# Patient Record
Sex: Female | Born: 2000
Health system: Southern US, Community
[De-identification: ages and names within clinical notes are randomized; demographics above are authoritative.]

## PROBLEM LIST (undated history)

## (undated) DIAGNOSIS — J45909 Unspecified asthma, uncomplicated: Secondary | ICD-10-CM

## (undated) DIAGNOSIS — F329 Major depressive disorder, single episode, unspecified: Secondary | ICD-10-CM

## (undated) DIAGNOSIS — L709 Acne, unspecified: Secondary | ICD-10-CM

## (undated) DIAGNOSIS — F32A Depression, unspecified: Secondary | ICD-10-CM

## (undated) DIAGNOSIS — F419 Anxiety disorder, unspecified: Secondary | ICD-10-CM

## (undated) HISTORY — DX: Acne, unspecified: L70.9

## (undated) HISTORY — DX: Depression, unspecified: F32.A

## (undated) HISTORY — DX: Anxiety disorder, unspecified: F41.9

## (undated) HISTORY — DX: Major depressive disorder, single episode, unspecified: F32.9

## (undated) HISTORY — DX: Unspecified asthma, uncomplicated: J45.909

---
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
## 2019-03-08 DIAGNOSIS — Z79899 Other long term (current) drug therapy: Secondary | ICD-10-CM | POA: Diagnosis not present

## 2019-03-08 DIAGNOSIS — L700 Acne vulgaris: Secondary | ICD-10-CM | POA: Diagnosis not present

## 2019-03-23 ENCOUNTER — Telehealth: Payer: Self-pay | Admitting: Psychiatry

## 2019-03-23 NOTE — Telephone Encounter (Signed)
Received Fax message - requesting clearance for Isotretinoin initiation from [facility]. Patient needs appointment since she has been noncompliant with treatment recommendations.

## 2019-04-16 ENCOUNTER — Telehealth: Payer: Self-pay | Admitting: Psychiatry

## 2019-04-30 ENCOUNTER — Ambulatory Visit (INDEPENDENT_AMBULATORY_CARE_PROVIDER_SITE_OTHER): Payer: No Typology Code available for payment source | Admitting: Psychiatry

## 2019-04-30 ENCOUNTER — Encounter: Payer: Self-pay | Admitting: Psychiatry

## 2019-04-30 ENCOUNTER — Other Ambulatory Visit: Payer: Self-pay

## 2019-04-30 DIAGNOSIS — F411 Generalized anxiety disorder: Secondary | ICD-10-CM | POA: Diagnosis not present

## 2019-04-30 DIAGNOSIS — Z9114 Patient's other noncompliance with medication regimen: Secondary | ICD-10-CM

## 2019-04-30 DIAGNOSIS — F321 Major depressive disorder, single episode, moderate: Secondary | ICD-10-CM

## 2019-04-30 DIAGNOSIS — Z91148 Patient's other noncompliance with medication regimen for other reason: Secondary | ICD-10-CM

## 2019-04-30 DIAGNOSIS — F5105 Insomnia due to other mental disorder: Secondary | ICD-10-CM | POA: Insufficient documentation

## 2019-04-30 MED ORDER — HYDROXYZINE HCL 25 MG PO TABS: 12.5000 mg | ORAL_TABLET | Freq: Three times a day (TID) | ORAL | 1 refills | Status: DC | PRN

## 2019-04-30 MED ORDER — SERTRALINE HCL 50 MG PO TABS: 50.0000 mg | ORAL_TABLET | Freq: Every day | ORAL | 0 refills | Status: DC

## 2019-04-30 NOTE — Progress Notes (Signed)
Virtual Visit via Telephone Note  I connected with Shelly Knight on 04/30/19 at 10:45 AM EDT by telephone and verified that I am speaking with the correct person using two identifiers.   I discussed the limitations, risks, security and privacy concerns of performing an evaluation and management service by telephone and the availability of in person appointments. I also discussed with the patient that there may be a patient responsible charge related to this service. The patient expressed understanding and agreed to proceed.    I discussed the assessment and treatment plan with the patient. The patient was provided an opportunity to ask questions and all were answered. The patient agreed with the plan and demonstrated an understanding of the instructions.   The patient was advised to call back or seek an in-person evaluation if the symptoms worsen or if the condition fails to improve as anticipated.   BH MD OP Progress Note  04/30/2019 5:07 PM Shelly Knight  MRN:  [id number]  Chief Complaint:  Chief Complaint    Follow-up     HPI: Shelly Knight is a 18 year old female, [place], has a history of depression, anxiety, was evaluated by telemedicine today.  Patient preferred to do a phone call.  Patient has been noncompliant with her follow-up visits.  Last appointment was 11/20/2018.  Patient at that visit was advised to return in 10 days however did not follow-up with recommendations.  Patient today reports she has been noncompliant with her medications.  She reports some anxiety symptoms on and off.  She reports she would like to get back on her Zoloft at this time.  She however denies any sleep problems or significant depression at this time.  She denies any suicidality.  She denies any homicidality.  Patient reports she is here today to reestablish care and agrees to keep up with follow-up appointments.  Patient denies any other concerns today.   Visit  Diagnosis:    ICD-10-CM   1. Generalized anxiety disorder  F41.1 hydrOXYzine (ATARAX/VISTARIL) 25 MG tablet    sertraline (ZOLOFT) 50 MG tablet  2. Major depressive disorder, single episode, moderate (HCC)  F32.1 hydrOXYzine (ATARAX/VISTARIL) 25 MG tablet    sertraline (ZOLOFT) 50 MG tablet  3. Noncompliance with medication regimen  Z91.14     Past Psychiatric History: I have reviewed past psychiatric history from my progress note from 03/01/2018.  Past trials of Zoloft.  Past Medical History:  Past Medical History:  Diagnosis Date  . Acne   . Anxiety   . Asthma   . Depression    History reviewed. No pertinent surgical history.  Family Psychiatric History: I have reviewed family psychiatric history from my progress note on 03/01/2018.  Family History:  Family History  Problem Relation Age of Onset  . Kidney cancer Maternal Uncle   . Bipolar disorder Maternal Uncle   . Alcohol abuse Maternal Uncle   . Breast cancer Other   . Bipolar disorder Mother   . Anxiety disorder Father   . Bipolar disorder Sister   . Bipolar disorder Maternal Grandfather     Social History: I have reviewed social history from my progress note on 03/01/2018. Social History   Socioeconomic History  . Marital status: Single    Spouse name: Not on file  . Number of children: Not on file  . Years of education: Not on file  . Highest education level: High school graduate  Occupational History  . Not on file  Social Needs  .  Financial resource strain: Not hard at all  . Food insecurity    Worry: Never true    Inability: Never true  . Transportation needs    Medical: No    Non-medical: No  Tobacco Use  . Smoking status: Never Smoker  . Smokeless tobacco: Never Used  Substance and Sexual Activity  . Alcohol use: No    Frequency: Never  . Drug use: No  . Sexual activity: Yes    Birth control/protection: Pill  Lifestyle  . Physical activity    Days per week: 1 day    Minutes per session: 10  min  . Stress: Only a little  Relationships  . Social Herbalist on phone: Once a week    Gets together: Once a week    Attends religious service: Never    Active member of club or organization: No    Attends meetings of clubs or organizations: Never    Relationship status: Never married  Other Topics Concern  . Not on file  Social History Narrative  . Not on file    Allergies:  Allergies  Allergen Reactions  . Omnicef [Cefdinir] Rash    Metabolic Disorder Labs: No results found for: HGBA1C, MPG No results found for: PROLACTIN No results found for: CHOL, TRIG, HDL, CHOLHDL, VLDL, LDLCALC Lab Results  Component Value Date   TSH 4.125 11/07/2018    Therapeutic Level Labs: No results found for: LITHIUM No results found for: VALPROATE No components found for:  CBMZ  Current Medications: Current Outpatient Medications  Medication Sig Dispense Refill  . hydrOXYzine (ATARAX/VISTARIL) 25 MG tablet Take 0.5-1 tablets (12.5-25 mg total) by mouth 3 (three) times daily as needed. For severe anxiety attacks 90 tablet 1  . ISOtretinoin (ACCUTANE) 30 MG capsule Take 30 mg by mouth daily.    Marland Kitchen LORazepam (ATIVAN) 0.5 MG tablet Take 1 tablet (0.5 mg total) by mouth every 8 (eight) hours as needed for anxiety. 20 tablet 0  . naproxen (NAPROSYN) 500 MG tablet Take 1 tablet (500 mg total) by mouth 2 (two) times daily. 10 tablet 0  . norethindrone-ethinyl estradiol (ORTHO-NOVUM 7/7/7, 28,) 0.5/0.75/1-35 MG-MCG tablet Take 1 tablet by mouth daily. 3 Package 3  . omeprazole (PRILOSEC) 20 MG capsule Take by mouth.    . promethazine (PHENERGAN) 25 MG tablet Take 1 tablet by mouth up to every 6 hours as needed for nausea/mild or moderate discomfort.  Do not combine with anxiety med.    . sertraline (ZOLOFT) 50 MG tablet Take 1 tablet (50 mg total) by mouth daily with breakfast. 30 tablet 0   No current facility-administered medications for this visit.       Musculoskeletal: Strength & Muscle Tone: UTA Gait & Station: UTA Patient leans: N/A  Psychiatric Specialty Exam: Review of Systems  Psychiatric/Behavioral: The patient is nervous/anxious.   All other systems reviewed and are negative.   There were no vitals taken for this visit.There is no height or weight on file to calculate BMI.  General Appearance: UTA  Eye Contact:  UTA  Speech:  Normal Rate  Volume:  Normal  Mood:  Anxious on and off   Affect:  UTA  Thought Process:  Goal Directed and Descriptions of Associations: Intact  Orientation:  Full (Time, Place, and Person)  Thought Content: Logical   Suicidal Thoughts:  No  Homicidal Thoughts:  No  Memory:  Immediate;   Fair Recent;   Fair Remote;   Fair  Judgement:  Fair  Insight:  Fair  Psychomotor Activity:  UTA  Concentration:  Concentration: Fair and Attention Span: Fair  Recall:  FiservFair  Fund of Knowledge: Fair  Language: Fair  Akathisia:  No  Handed:  Right  AIMS (if indicated): denies tremors, rigidity  Assets:  Communication Skills Desire for Improvement Housing Social Support  ADL's:  Intact  Cognition: WNL  Sleep:  Fair   Screenings:   Assessment and Plan: Shelly Knight is a 18 year old Caucasian female, single, lives in [place], was evaluated by telemedicine today.  Patient today reports she is noncompliant with medications however is interested in restarting medication regimen.  Plan as noted below.  Plan For GAD-unstable Restart Zoloft 50 mg p.o. daily Hydroxyzine as needed.   For MDD- stable We will monitor closely.  For noncompliance with medications We will continue to educate.  Patient was last seen on 11/20/2018 and was restarted on medications as well as was referred for psychotherapy sessions however was noncompliant.  Discussed with patient that she may be dismissed from clinic if she is not following recommendations.  Follow-up in clinic in 4 weeks or sooner if needed.  I  have spent atleast 15 minutes non  face to face with patient today. More than 50 % of the time was spent for psychoeducation and supportive psychotherapy and care coordination.  This note was generated in part or whole with voice recognition software. Voice recognition is usually quite accurate but there are transcription errors that can and very often do occur. I apologize for any typographical errors that were not detected and corrected.        Jomarie LongsSaramma Trent Gabler, MD 04/30/2019, 5:07 PM

## 2019-05-03 ENCOUNTER — Telehealth: Payer: Self-pay

## 2019-05-03 NOTE — Telephone Encounter (Signed)
  Pt called and wanted to know if you has spoken to [facility] about if  It was ok for pt the accutane.   Ursula Alert, MD to Me      03/23/19 2:15 PM Please let [facility] know we cannot clear her for treatment since she has been noncompliant with her follow up appt and recommendations per [place].  Ursula Alert, MD     03/23/19 2:15 PM Note   Received Fax message - requesting clearance for Isotretinoin initiation from [facility]. Patient needs appointment since she has been noncompliant with treatment recommendations.

## 2019-05-08 NOTE — Telephone Encounter (Signed)
Please let patient know that as per our conversation on her last visit , I had asked her to call [facility] to send [place] whatever needed to be filled out for clearance. Please however go ahead and send last office notes from [place] if that is all they need with patient's formal consent. thanks

## 2019-05-09 NOTE — Telephone Encounter (Signed)
Yes Please print out a letter we can fax to [facility] stating she can start treatment at their office as long as she is compliant with her follow ups with [place]. thanks

## 2019-05-09 NOTE — Telephone Encounter (Signed)
pt mother called states that all the [facility] just needs a note saying it ok for her to take the accutane she states there is no form.

## 2019-05-16 DIAGNOSIS — L700 Acne vulgaris: Secondary | ICD-10-CM | POA: Diagnosis not present

## 2019-05-17 DIAGNOSIS — L700 Acne vulgaris: Secondary | ICD-10-CM | POA: Diagnosis not present

## 2019-05-17 DIAGNOSIS — Z79899 Other long term (current) drug therapy: Secondary | ICD-10-CM | POA: Diagnosis not present

## 2019-05-29 ENCOUNTER — Other Ambulatory Visit: Payer: Self-pay

## 2019-05-29 ENCOUNTER — Ambulatory Visit (INDEPENDENT_AMBULATORY_CARE_PROVIDER_SITE_OTHER): Payer: Self-pay | Admitting: Psychiatry

## 2019-05-29 DIAGNOSIS — Z5329 Procedure and treatment not carried out because of patient's decision for other reasons: Secondary | ICD-10-CM

## 2019-05-29 NOTE — Progress Notes (Signed)
No response to calls 

## 2019-05-29 NOTE — Telephone Encounter (Signed)
done

## 2019-09-17 ENCOUNTER — Ambulatory Visit: Payer: No Typology Code available for payment source | Admitting: Family Medicine

## 2019-09-17 NOTE — Progress Notes (Deleted)
Patient: Shelly Knight, Female    DOB: [birth year], 19 y.o.   MRN: [id number] Visit Date: 09/17/2019  Today's Provider: Lavon Paganini, MD   No chief complaint on file.  Subjective:     New Patient:  Shelly Knight is a 19 y.o. female who presents today to [place].  She feels {DESC; WELL/FAIRLY WELL/POORLY:18703}. She reports exercising ***. She reports she is sleeping {DESC; WELL/FAIRLY WELL/POORLY:18703}.  -----------------------------------------------------------------   Review of Systems  Constitutional: Negative.   HENT: Negative.   Eyes: Negative.   Respiratory: Negative.   Cardiovascular: Negative.   Gastrointestinal: Negative.   Endocrine: Negative.   Genitourinary: Negative.   Musculoskeletal: Negative.   Skin: Negative.   Allergic/Immunologic: Negative.   Neurological: Negative.   Hematological: Negative.   Psychiatric/Behavioral: Negative.     Social History      She  reports that she has never smoked. She has never used smokeless tobacco. She reports that she does not drink alcohol or use drugs.       Social History   Socioeconomic History  . Marital status: Single    Spouse name: Not on file  . Number of children: Not on file  . Years of education: Not on file  . Highest education level: High school graduate  Occupational History  . Not on file  Tobacco Use  . Smoking status: Never Smoker  . Smokeless tobacco: Never Used  Substance and Sexual Activity  . Alcohol use: No  . Drug use: No  . Sexual activity: Yes    Birth control/protection: Pill  Other Topics Concern  . Not on file  Social History Narrative  . Not on file   Social Determinants of Health   Financial Resource Strain:   . Difficulty of Paying Living Expenses: Not on file  Food Insecurity:   . Worried About Charity fundraiser in the Last Year: Not on file  . Ran Out of Food in the Last Year: Not on file  Transportation Needs:   . Lack of  Transportation (Medical): Not on file  . Lack of Transportation (Non-Medical): Not on file  Physical Activity:   . Days of Exercise per Week: Not on file  . Minutes of Exercise per Session: Not on file  Stress:   . Feeling of Stress : Not on file  Social Connections:   . Frequency of Communication with Friends and Family: Not on file  . Frequency of Social Gatherings with Friends and Family: Not on file  . Attends Religious Services: Not on file  . Active Member of Clubs or Organizations: Not on file  . Attends Archivist Meetings: Not on file  . Marital Status: Not on file    Past Medical History:  Diagnosis Date  . Acne   . Anxiety   . Asthma   . Depression      Patient Active Problem List   Diagnosis Date Noted  . Generalized anxiety disorder 04/30/2019  . Major depressive disorder, single episode, moderate ([place]) 04/30/2019  . Insomnia due to mental condition 04/30/2019  . Noncompliance with medication regimen 04/30/2019    No past surgical history on file.  Family History        Family Status  Relation Name Status  . Mat Uncle  (Not Specified)  . Other [place] Alive  . Mother  (Not Specified)  . Father  (Not Specified)  . Sister  (Not Specified)  . MGF  (Not Specified)  Her family history includes Alcohol abuse in her maternal uncle; Anxiety disorder in her father; Bipolar disorder in her maternal grandfather, maternal uncle, mother, and sister; Breast cancer in an other family member; Kidney cancer in her maternal uncle.      Allergies  Allergen Reactions  . Omnicef [Cefdinir] Rash     Current Outpatient Medications:  .  hydrOXYzine (ATARAX/VISTARIL) 25 MG tablet, Take 0.5-1 tablets (12.5-25 mg total) by mouth 3 (three) times daily as needed. For severe anxiety attacks, Disp: 90 tablet, Rfl: 1 .  ISOtretinoin (ACCUTANE) 30 MG capsule, Take 30 mg by mouth daily., Disp: , Rfl:  .  LORazepam (ATIVAN) 0.5 MG tablet, Take 1 tablet (0.5 mg total)  by mouth every 8 (eight) hours as needed for anxiety., Disp: 20 tablet, Rfl: 0 .  naproxen (NAPROSYN) 500 MG tablet, Take 1 tablet (500 mg total) by mouth 2 (two) times daily., Disp: 10 tablet, Rfl: 0 .  norethindrone-ethinyl estradiol (ORTHO-NOVUM 7/7/7, 28,) 0.5/0.75/1-35 MG-MCG tablet, Take 1 tablet by mouth daily., Disp: 3 Package, Rfl: 3 .  omeprazole (PRILOSEC) 20 MG capsule, Take by mouth., Disp: , Rfl:  .  promethazine (PHENERGAN) 25 MG tablet, Take 1 tablet by mouth up to every 6 hours as needed for nausea/mild or moderate discomfort.  Do not combine with anxiety med., Disp: , Rfl:  .  sertraline (ZOLOFT) 50 MG tablet, Take 1 tablet (50 mg total) by mouth daily with breakfast., Disp: 30 tablet, Rfl: 0   Patient Care Team: Patient, No Pcp Per as PCP - General (General Practice)    Objective:    Vitals: There were no vitals taken for this visit.  There were no vitals filed for this visit.   Physical Exam   Depression Screen No flowsheet data found.     Assessment & Plan:     Routine Health Maintenance and Physical Exam  Exercise Activities and Dietary recommendations Goals   None      There is no immunization history on file for this patient.  Health Maintenance  Topic Date Due  . CHLAMYDIA SCREENING  01/02/2015  . HIV Screening  01/02/2015  . TETANUS/TDAP  01/02/2019  . INFLUENZA VACCINE  04/14/2019     Discussed health benefits of physical activity, and encouraged her to engage in regular exercise appropriate for her age and condition.    --------------------------------------------------------------------    Shirlee Latch, MD  [facility]

## 2019-11-06 ENCOUNTER — Telehealth: Payer: Self-pay

## 2019-11-06 NOTE — Telephone Encounter (Signed)
Copied from CRM [id number]. Topic: Appointment Scheduling - Scheduling Inquiry for Clinic >> Nov 06, 2019 12:26 PM Lynne Logan D wrote: Reason for CRM: Pt had a new pt appt with Dr. Leonard Schwartz in January but was a No Show. She would like to be scheduled again. Advised that Dr. Leonard Schwartz may need to approve since she is not accepting new pts currently. Please advise.

## 2019-11-09 NOTE — Telephone Encounter (Signed)
Apt 12/27/2019 at 10am  Thanks,   -Vernona Rieger

## 2019-11-09 NOTE — Telephone Encounter (Signed)
Ok to schedule at first available. Will be accepting new patients again next month. One more no show for the new patient appt and she will not be able to schedule again

## 2019-11-15 IMAGING — CR DG CHEST 2V
2 series · 2 of 2 positions shown · non-contrast
Comparison: CT chest 11/07/2018

CLINICAL DATA: Medial chest pain with shortness of breath,
weakness, and dizziness.

EXAM:
CHEST - 2 VIEW

[chest pa]
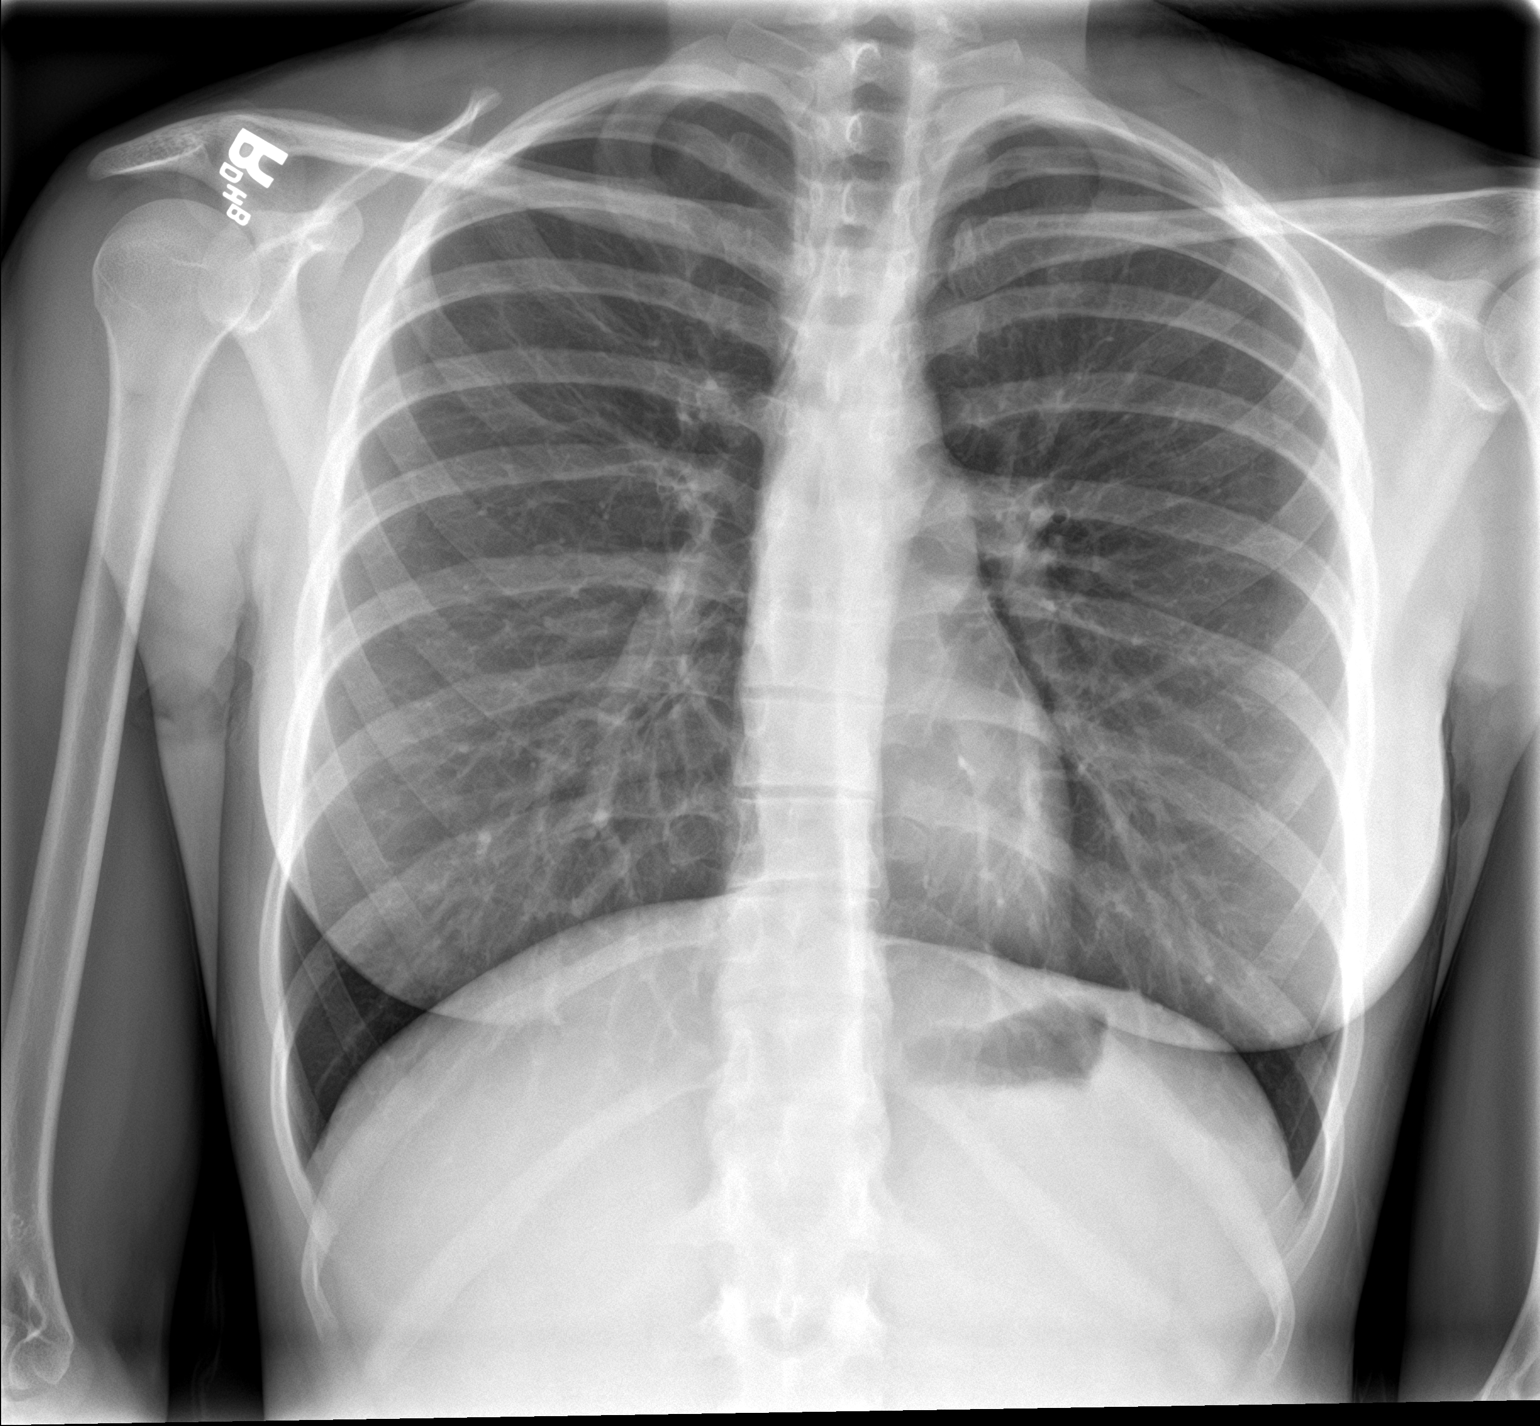

[chest lat]
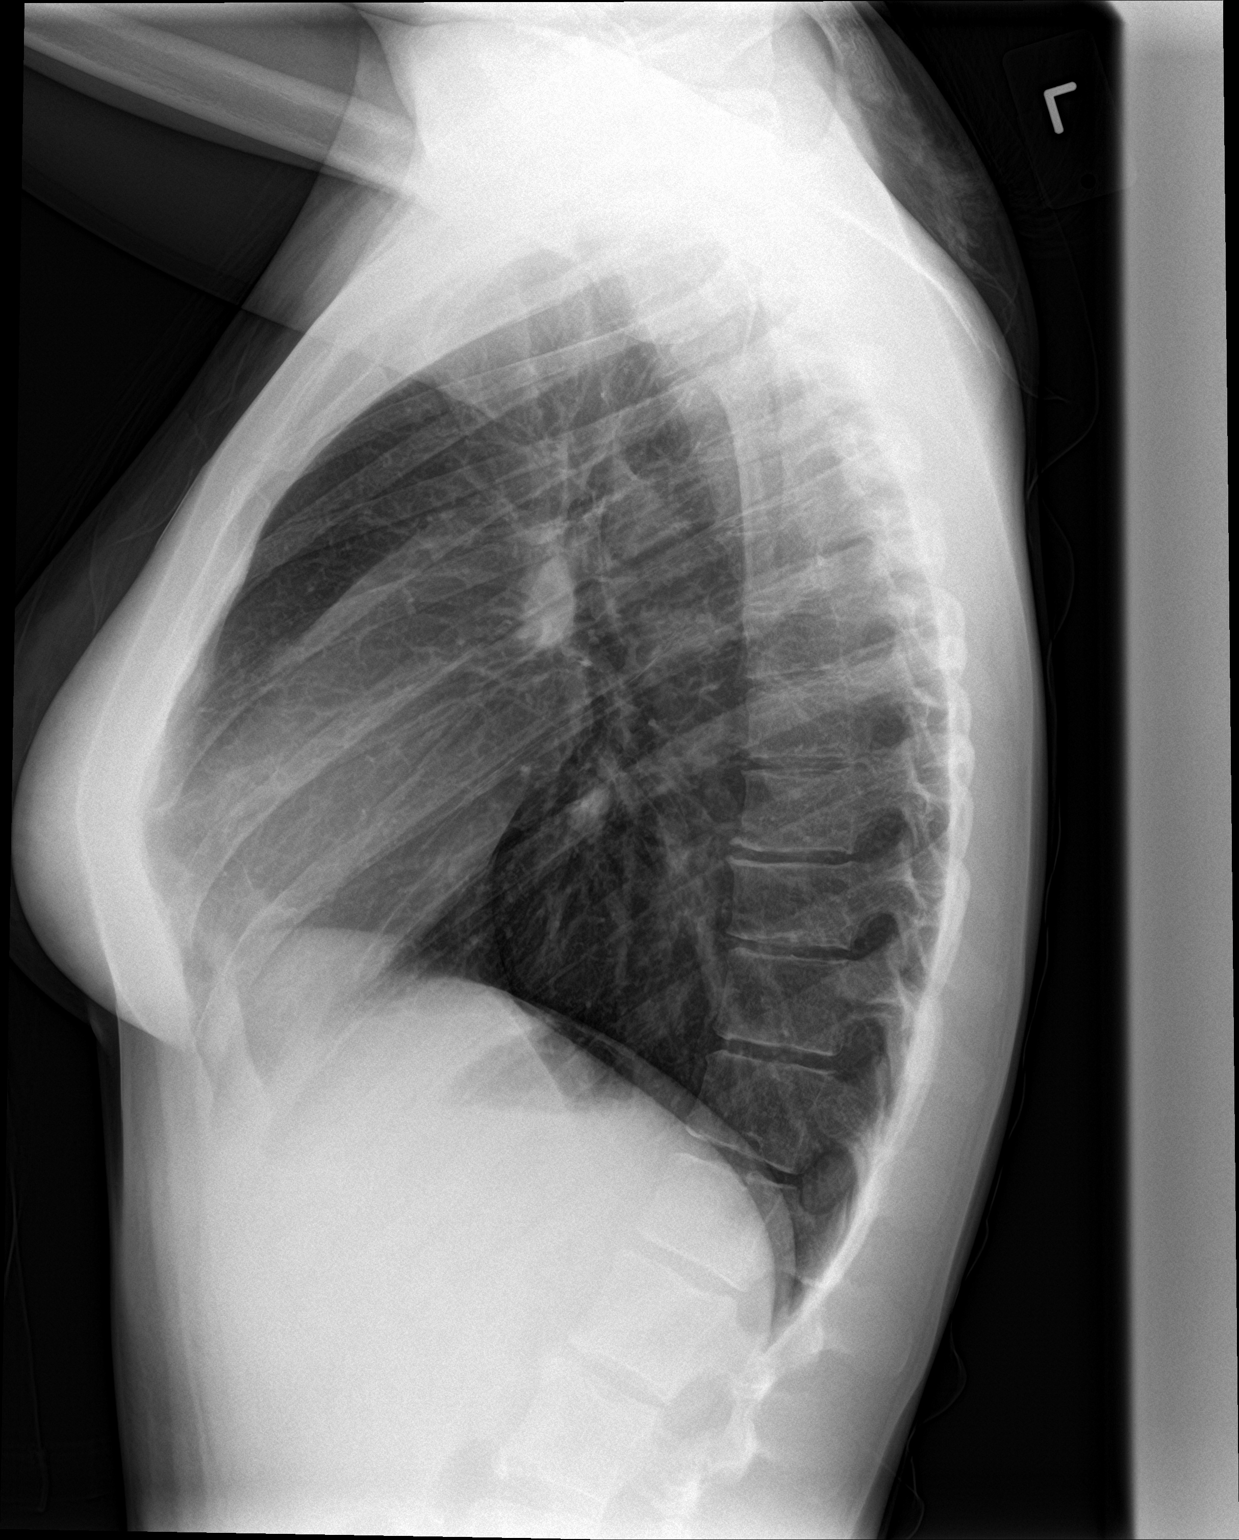

[2 of 2 positions shown; findings below may reference images not displayed]

FINDINGS: The heart size and mediastinal contours are within normal limits.
Both lungs are clear. The visualized skeletal structures are
unremarkable.
IMPRESSION: No active cardiopulmonary disease.

## 2019-11-23 DIAGNOSIS — L700 Acne vulgaris: Secondary | ICD-10-CM | POA: Insufficient documentation

## 2019-12-06 ENCOUNTER — Ambulatory Visit (INDEPENDENT_AMBULATORY_CARE_PROVIDER_SITE_OTHER): Payer: No Typology Code available for payment source | Admitting: Dermatology

## 2019-12-06 ENCOUNTER — Other Ambulatory Visit: Payer: Self-pay

## 2019-12-06 VITALS — Wt 120.0 lb

## 2019-12-06 DIAGNOSIS — K130 Diseases of lips: Secondary | ICD-10-CM | POA: Diagnosis not present

## 2019-12-06 DIAGNOSIS — L700 Acne vulgaris: Secondary | ICD-10-CM

## 2019-12-06 DIAGNOSIS — Z79899 Other long term (current) drug therapy: Secondary | ICD-10-CM

## 2019-12-06 MED ORDER — HYDROCORTISONE 2.5 % EX CREA: TOPICAL_CREAM | CUTANEOUS | 1 refills | Status: DC

## 2019-12-06 NOTE — Progress Notes (Signed)
   Follow-Up Visit   Subjective  Shelly Knight is a 19 y.o. female who presents for the following: Acne (Isotretinoin therapy, Absorica 20mg . ).  She denies mood changes, SI, HI abdominal pain, other concerns. She has some dryness of the lips.   The following portions of the chart were reviewed this encounter and updated as appropriate: Tobacco  Allergies  Meds  Problems  Med Hx  Surg Hx  Fam Hx      Review of Systems: No other skin or systemic complaints.  Objective  Well appearing patient in no apparent distress; mood and affect are within normal limits.  A focused examination was performed including face, chest, back. Relevant physical exam findings are noted in the Assessment and Plan.  Objective  face, chest, back: Face with trace open comedones, few small inflammatory papules Chest with few inflammatory papules Back with 1+ open and closed comedones, few inflammatory papules  Objective  Lips: Erythema, peeling at lips  Assessment & Plan  Acne vulgaris face, chest, back  Chronic recalcitrant acne. Not at goal.  Cont Absorica increasing to 40mg  1 PO QD. Tolerating well currently with improvement in acne. Patient too early to do urine pregnancy test so she will come in Monday.  Isotretinoin counseling - Do not get pregnant, do not share pills, do not donate blood. Isotretinoin (except for Absorica brand) must be taken with a fatty meal for best absorption.     Cheilitis Lips  Start HC 2.5% cream BID x 1 week to lips #30 1RF Rec Dr. Cortibalm daily. Samples given.   hydrocortisone 2.5 % cream - Lips  Return in about 1 month (around 01/09/2020) for schedule between 4/28 and 4/30 for accutane.   5/28, RMA, am acting as scribe for 5/30, MD .  Documentation: I have reviewed the above documentation for accuracy and completeness, and I agree with the above.  Anise Salvo, MD

## 2019-12-06 NOTE — Patient Instructions (Addendum)
1. Isotretinoin counseling - Do not get pregnant, do not share pills, do not donate blood. Isotretinoin (except for Absorica brand) must be taken with a fatty meal for best absorption.

## 2019-12-09 ENCOUNTER — Encounter: Payer: Self-pay | Admitting: Dermatology

## 2019-12-10 ENCOUNTER — Telehealth: Payer: Self-pay

## 2019-12-10 MED ORDER — ISOTRETINOIN 40 MG PO CAPS: 40.0000 mg | ORAL_CAPSULE | Freq: Every day | ORAL | 0 refills | Status: AC

## 2019-12-10 NOTE — Telephone Encounter (Signed)
Patient walked in the office for urine pregnancy test, pregnancy test results negative, confirmed pt in Ipledge, Isotretinoin 40mg  #30 erx'd to [facility]

## 2019-12-27 ENCOUNTER — Ambulatory Visit: Payer: Self-pay | Admitting: Family Medicine

## 2020-01-11 ENCOUNTER — Ambulatory Visit: Payer: No Typology Code available for payment source | Admitting: Dermatology

## 2020-01-14 ENCOUNTER — Ambulatory Visit: Payer: No Typology Code available for payment source | Admitting: Dermatology

## 2020-01-14 ENCOUNTER — Other Ambulatory Visit: Payer: Self-pay

## 2020-01-14 VITALS — Wt 120.0 lb

## 2020-01-14 DIAGNOSIS — L853 Xerosis cutis: Secondary | ICD-10-CM | POA: Diagnosis not present

## 2020-01-14 DIAGNOSIS — K130 Diseases of lips: Secondary | ICD-10-CM

## 2020-01-14 DIAGNOSIS — L700 Acne vulgaris: Secondary | ICD-10-CM | POA: Diagnosis not present

## 2020-01-14 DIAGNOSIS — Z79899 Other long term (current) drug therapy: Secondary | ICD-10-CM | POA: Diagnosis not present

## 2020-01-14 MED ORDER — ISOTRETINOIN 40 MG PO CAPS: 40.0000 mg | ORAL_CAPSULE | Freq: Every day | ORAL | 0 refills | Status: DC

## 2020-01-14 NOTE — Progress Notes (Signed)
   Isotretinoin Follow-Up Visit   Subjective  Shelly Knight is a 19 y.o. female who presents for the following: Acne (face, back 8wk Isotretinoin, Accutane 40mg  1 po qd).  Week # 8  Isotretinoin F/U - 01/14/20 0900      Isotretinoin Follow Up   iPledge #  03/15/20    Date  01/14/20    Weight  120 lb (54.4 kg)    Two Forms of Birth Control  Female Condom;Oral Contraceptives (w/ estrogen)    Acne breakouts since last visit?  Yes      Dosage   Current (To Date) Dosage (mg)  40mg       Side Effects   Skin  Chapped Lips;Dry Lips    Gastrointestinal  WNL    Neurological  WNL    Constitutional  WNL       Side effects: Dry skin, dry lips  Denies changes in night vision, shortness of breath, abdominal pain, nausea, vomiting, diarrhea, blood in stool or urine, visual changes, headaches, epistaxis, joint pain, myalgias, mood changes, depression, or suicidal ideation.   Patient is not pregnant, not seeking pregnancy, and not breastfeeding.   The following portions of the chart were reviewed this encounter and updated as appropriate: medications, allergies, medical history  Review of Systems:  No other skin or systemic complaints except as noted in HPI or Assessment and Plan.  Objective  Well appearing patient in no apparent distress; mood and affect are within normal limits.  An examination of the face, neck, chest, and back was performed and relevant findings are noted below.   Objective  Head - Anterior (Face): Closed comedones cheeks, forehead, chin, inflammatory pap L cheek, inflamed comedones upper back   Assessment & Plan   Acne vulgaris Head - Anterior (Face)  Accutane wk 8 2 forms BC - female latex condom, oral birth control IPLEDGE# 03/15/20 Pharmacy [facility] Last labs 11/08/2019 Cont Isotretinoin 40mg  1 po qd with fatty meal Pt confirmed in [facility] program and was advised to demonstrate comprehension.  Urine pregnancy test performed in office today  and was negative.  Patient demonstrates comprehension and confirms she will not get pregnant.     While taking Isotretinoin and for 30 days after you finish the medication, do not get pregnant, do not share pills, do not donate blood. Isotretinoin is best absorbed when taken with a fatty meal. Isotretinoin can make you sensitive to the sun. Daily careful sun protection including sunscreen SPF 30+ when outdoors is recommended.  Xerosis - Continue emollients as directed  Cheilitis - Continue lip balm as directed, Dr. 11/10/2019 Cortibalm recommended Long term medication management.  Follow-up in 30 days.    Documentation: I have reviewed the above documentation for accuracy and completeness, and I agree with the above.  , MD  I, [facility], RMA, am acting as scribe for Clayborne Artist, MD .

## 2020-02-13 ENCOUNTER — Other Ambulatory Visit: Payer: Self-pay

## 2020-02-13 ENCOUNTER — Ambulatory Visit (INDEPENDENT_AMBULATORY_CARE_PROVIDER_SITE_OTHER): Payer: No Typology Code available for payment source | Admitting: Dermatology

## 2020-02-13 VITALS — Wt 120.0 lb

## 2020-02-13 DIAGNOSIS — L853 Xerosis cutis: Secondary | ICD-10-CM | POA: Diagnosis not present

## 2020-02-13 DIAGNOSIS — K130 Diseases of lips: Secondary | ICD-10-CM

## 2020-02-13 DIAGNOSIS — L700 Acne vulgaris: Secondary | ICD-10-CM | POA: Diagnosis not present

## 2020-02-13 DIAGNOSIS — Z79899 Other long term (current) drug therapy: Secondary | ICD-10-CM

## 2020-02-13 MED ORDER — ISOTRETINOIN 40 MG PO CAPS: 40.0000 mg | ORAL_CAPSULE | Freq: Every day | ORAL | 0 refills | Status: AC

## 2020-02-13 NOTE — Progress Notes (Signed)
   Follow-Up Visit   Subjective  Shelly Knight is a 19 y.o. female who presents for the following: Acne (Isotretinoin week 12 40 mg po QD - patient c/o chapped lips and is currently using HC 2.5% cream QD PRN).  Isotretinoin F/U - 02/13/20 0900      Isotretinoin Follow Up   iPledge #  [id number]    Date  02/13/20    Weight  120 lb (54.4 kg)    Two Forms of Birth Control  Female Condom;Oral Contraceptives (w/ estrogen)    Acne breakouts since last visit?  No      Side Effects   Skin  Chapped Lips    Gastrointestinal  WNL    Neurological  WNL    Constitutional  WNL       The following portions of the chart were reviewed this encounter and updated as appropriate:     Review of Systems:  No other skin or systemic complaints except as noted in HPI or Assessment and Plan.  Objective  Well appearing patient in no apparent distress; mood and affect are within normal limits.  A focused examination was performed including face, neck, chest and back. Relevant physical exam findings are noted in the Assessment and Plan.  Objective  Head - Anterior (Face): Resolving inflammatory papule on the right perioral, closed comedones on the forehead.   Assessment & Plan  Acne vulgaris Head - Anterior (Face)  Week 12 - improving Continue Isotretinoin 40mg  po QD. #30 0RF. Will plan on checking labs next visit.  While taking Isotretinoin and for 30 days after you finish the medication, do not get pregnant, do not share pills, do not donate blood. Isotretinoin is best absorbed when taken with a fatty meal. Isotretinoin can make you sensitive to the sun. Daily careful sun protection including sunscreen SPF 30+ when outdoors is recommended.   In office pregnancy test negative. Lot # Exp. Date: 07/13/2021  2 forms BC - female latex condom, oral birth control IPLEDGE# 07/15/2021 Pharmacy [facility]   Reordered Medications ISOtretinoin (ACCUTANE) 40 MG capsule  Cheilitis  Secondary to Isotretinoin Treatment - Continue lip balm as directed, Dr. [phone] Cortibalm or Aquaphor recommended - Continue HC 2.5% cream QD PRN flares.  Xerosis Secondary to Isotretinoin Treatment - diffuse xerotic patches - recommend gentle, hydrating skin care - gentle skin care handout given  Long Term Medication Management Performed Today   Return in about 1 month (around 03/14/2020).  05/15/2020, CMA, am acting as scribe for Maylene Roes, MD .  Documentation: I have reviewed the above documentation for accuracy and completeness, and I agree with the above.  Willeen Niece MD

## 2020-02-13 NOTE — Patient Instructions (Signed)
While taking Isotretinoin and for 30 days after you finish the medication, do not get pregnant, do not share pills, do not donate blood. Isotretinoin is best absorbed when taken with a fatty meal. Isotretinoin can make you sensitive to the sun. Daily careful sun protection including sunscreen SPF 30+ when outdoors is recommended.  

## 2020-03-04 NOTE — Progress Notes (Deleted)
New patient visit   Patient: Shelly Knight   DOB: 09-25-2000   19 y.o. Female  MRN: [id number] Visit Date: 03/05/2020  Today's healthcare provider: Lavon Paganini, MD   No chief complaint on file.  Subjective    Shelly Knight is a 19 y.o. female who presents today as a new patient to establish care.  HPI  ***  Past Medical History:  Diagnosis Date  . Acne    Accutane therapy. iPledge: [id number]  . Anxiety   . Asthma   . Depression    No past surgical history on file. Family Status  Relation Name Status  . Mat Uncle  (Not Specified)  . Other [place] Alive  . Mother  (Not Specified)  . Father  (Not Specified)  . Sister  (Not Specified)  . MGF  (Not Specified)   Family History  Problem Relation Age of Onset  . Kidney cancer Maternal Uncle   . Bipolar disorder Maternal Uncle   . Alcohol abuse Maternal Uncle   . Breast cancer Other   . Bipolar disorder Mother   . Anxiety disorder Father   . Bipolar disorder Sister   . Bipolar disorder Maternal Grandfather    Social History   Socioeconomic History  . Marital status: Single    Spouse name: Not on file  . Number of children: Not on file  . Years of education: Not on file  . Highest education level: High school graduate  Occupational History  . Not on file  Tobacco Use  . Smoking status: Never Smoker  . Smokeless tobacco: Never Used  Vaping Use  . Vaping Use: Every day  Substance and Sexual Activity  . Alcohol use: No  . Drug use: No  . Sexual activity: Yes    Birth control/protection: Pill  Other Topics Concern  . Not on file  Social History Narrative  . Not on file   Social Determinants of Health   Financial Resource Strain:   . Difficulty of Paying Living Expenses:   Food Insecurity:   . Worried About Charity fundraiser in the Last Year:   . Arboriculturist in the Last Year:   Transportation Needs:   . Film/video editor (Medical):   Marland Kitchen Lack of Transportation (Non-Medical):     Physical Activity:   . Days of Exercise per Week:   . Minutes of Exercise per Session:   Stress:   . Feeling of Stress :   Social Connections:   . Frequency of Communication with Friends and Family:   . Frequency of Social Gatherings with Friends and Family:   . Attends Religious Services:   . Active Member of Clubs or Organizations:   . Attends Archivist Meetings:   Marland Kitchen Marital Status:    Outpatient Medications Prior to Visit  Medication Sig  . hydrocortisone 2.5 % cream Apply twice a day to lips for up to 1 week as needed.  . hydrOXYzine (ATARAX/VISTARIL) 25 MG tablet Take 0.5-1 tablets (12.5-25 mg total) by mouth 3 (three) times daily as needed. For severe anxiety attacks (Patient not taking: Reported on 02/13/2020)  . ISOtretinoin (ACCUTANE) 30 MG capsule Take 30 mg by mouth daily.  . ISOtretinoin (ACCUTANE) 40 MG capsule Take 1 capsule (40 mg total) by mouth daily. Take with fatty meal  . naproxen (NAPROSYN) 500 MG tablet Take 1 tablet (500 mg total) by mouth 2 (two) times daily. (Patient not taking: Reported on 02/13/2020)  . norethindrone-ethinyl estradiol (  ORTHO-NOVUM 7/7/7, 28,) 0.5/0.75/1-35 MG-MCG tablet Take 1 tablet by mouth daily.  Marland Kitchen omeprazole (PRILOSEC) 20 MG capsule Take by mouth.  . promethazine (PHENERGAN) 25 MG tablet Take 1 tablet by mouth up to every 6 hours as needed for nausea/mild or moderate discomfort.  Do not combine with anxiety med.  . sertraline (ZOLOFT) 50 MG tablet Take 1 tablet (50 mg total) by mouth daily with breakfast.   No facility-administered medications prior to visit.   Allergies  Allergen Reactions  . Omnicef [Cefdinir] Rash     There is no immunization history on file for this patient.  Health Maintenance  Topic Date Due  . Hepatitis C Screening  Never done  . CHLAMYDIA SCREENING  Never done  . HIV Screening  Never done  . TETANUS/TDAP  Never done  . INFLUENZA VACCINE  04/13/2020    Patient Care Team: Patient, No Pcp Per  as PCP - General (General Practice)  Review of Systems  Constitutional: Negative.   HENT: Negative.   Eyes: Negative.   Respiratory: Negative.   Cardiovascular: Negative.   Gastrointestinal: Negative.   Endocrine: Negative.   Genitourinary: Negative.   Musculoskeletal: Negative.   Skin: Negative.   Allergic/Immunologic: Negative.   Neurological: Negative.   Hematological: Negative.   Psychiatric/Behavioral: Negative.     Last CBC Lab Results  Component Value Date   WBC 12.7 (H) 11/08/2018   HGB 13.6 11/08/2018   HCT 40.0 11/08/2018   MCV 92.2 11/08/2018   MCH 31.3 11/08/2018   RDW 11.9 11/08/2018   PLT 254 11/08/2018   Last metabolic panel Lab Results  Component Value Date   GLUCOSE 150 (H) 11/08/2018   NA 136 11/08/2018   K 3.7 11/08/2018   CL 104 11/08/2018   CO2 22 11/08/2018   BUN 13 11/08/2018   CREATININE 0.60 11/08/2018   GFRNONAA >60 11/08/2018   GFRAA >60 11/08/2018   CALCIUM 9.2 11/08/2018   ANIONGAP 10 11/08/2018   Last thyroid functions Lab Results  Component Value Date   TSH 4.125 11/07/2018     Objective    There were no vitals taken for this visit. Physical Exam ***  Depression Screen No flowsheet data found. No results found for any visits on 03/05/20.  Assessment & Plan     ***  No follow-ups on file.     {provider attestation***:1}   Shirlee Latch, MD  [facility] [phone] (phone) [phone] (fax)  [facility]

## 2020-03-05 ENCOUNTER — Ambulatory Visit: Payer: Self-pay | Admitting: Family Medicine

## 2020-03-18 ENCOUNTER — Ambulatory Visit: Payer: No Typology Code available for payment source | Admitting: Dermatology

## 2020-03-25 ENCOUNTER — Ambulatory Visit (INDEPENDENT_AMBULATORY_CARE_PROVIDER_SITE_OTHER): Payer: No Typology Code available for payment source | Admitting: Dermatology

## 2020-03-25 ENCOUNTER — Other Ambulatory Visit: Payer: Self-pay

## 2020-03-25 ENCOUNTER — Encounter: Payer: Self-pay | Admitting: Dermatology

## 2020-03-25 VITALS — Wt 120.0 lb

## 2020-03-25 DIAGNOSIS — L700 Acne vulgaris: Secondary | ICD-10-CM

## 2020-03-25 DIAGNOSIS — L237 Allergic contact dermatitis due to plants, except food: Secondary | ICD-10-CM | POA: Diagnosis not present

## 2020-03-25 DIAGNOSIS — K130 Diseases of lips: Secondary | ICD-10-CM | POA: Diagnosis not present

## 2020-03-25 DIAGNOSIS — L853 Xerosis cutis: Secondary | ICD-10-CM

## 2020-03-25 DIAGNOSIS — Z79899 Other long term (current) drug therapy: Secondary | ICD-10-CM

## 2020-03-25 MED ORDER — CLOBETASOL PROPIONATE 0.05 % EX CREA: 1.0000 "application " | TOPICAL_CREAM | Freq: Two times a day (BID) | CUTANEOUS | 0 refills | Status: DC

## 2020-03-25 MED ORDER — ISOTRETINOIN 40 MG PO CAPS: 40.0000 mg | ORAL_CAPSULE | Freq: Every day | ORAL | 0 refills | Status: AC

## 2020-03-25 MED ORDER — HYDROCORTISONE 2.5 % EX OINT: TOPICAL_OINTMENT | Freq: Two times a day (BID) | CUTANEOUS | 1 refills | Status: DC

## 2020-03-25 NOTE — Progress Notes (Signed)
   Isotretinoin Follow-Up Visit   Subjective  Shelly Knight is a 19 y.o. female who presents for the following: Acne.  She was also exposed to poison ivy and has an itchy rash and wants treatment for it.  Week # 16  Isotretinoin F/U - 03/25/20 1200      Isotretinoin Follow Up   iPledge # [id number]    Date 03/25/20    Weight 120 lb (54.4 kg)    Two Forms of Birth Control Female Condom;Oral Contraceptives (w/ estrogen)    Acne breakouts since last visit? No      Side Effects   Skin Chapped Lips    Gastrointestinal WNL    Neurological WNL    Constitutional WNL             Side effects: Dry skin, dry lips  Denies changes in night vision, shortness of breath, abdominal pain, nausea, vomiting, diarrhea, blood in stool or urine, visual changes, headaches, epistaxis, joint pain, myalgias, mood changes, depression, or suicidal ideation.   Patient is not pregnant, not seeking pregnancy, and not breastfeeding.   The following portions of the chart were reviewed this encounter and updated as appropriate: medications, allergies, medical history  Review of Systems:  No other skin or systemic complaints except as noted in HPI or Assessment and Plan.  Objective  Well appearing patient in no apparent distress; mood and affect are within normal limits.  An examination of the face, neck, chest, and back was performed and relevant findings are noted below.   Objective  Forehead: Closed comedones forehead, otherwise clear  Objective  Left Thigh - Anterior, Right Forearm, Right Upper Back: Scattered pink edematous pink papules with crusting  Objective  Bilateral lip: Dry scaly lips with mild crusting   Assessment & Plan   Acne vulgaris Forehead  Week #16- improving Continue 40mg  Isotretinoin once daily take with fatty meal  Patient confirmed in iPledge and isotretinoin sent to pharmacy.  Urine pregnancy test performed in office today and was negative.  Patient  demonstrates comprehension and confirms she will not get pregnant.   Labs on follow up  While taking Isotretinoin and for 30 days after you finish the medication, do not get pregnant, do not share pills, do not donate blood. Isotretinoin is best absorbed when taken with a fatty meal. Isotretinoin can make you sensitive to the sun. Daily careful sun protection including sunscreen SPF 30+ when outdoors is recommended.   ISOtretinoin (ACCUTANE) 40 MG capsule - Forehead  Allergic contact dermatitis due to plants, except food (3) Right Forearm; Left Thigh - Anterior; Right Upper Back  Start clobetasol cream to affected areas bid until rash clear. Avoid applying to face, groin, and axilla. Use as directed. Risk of skin atrophy with long-term use reviewed.     Ordered Medications: clobetasol cream (TEMOVATE) 0.05 %  Cheilitis Bilateral lip  Continue Hydrocortisone 2.5% ointment to lips bid prn flares  Continue lip balm as directed, Dr. Cortibalm recommended  hydrocortisone 2.5 % cream - Bilateral lip  hydrocortisone 2.5 % ointment - Bilateral lip   Xerosis - Continue emollients as directed   Follow-up in 30 days.  Documentation: I have reviewed the above documentation for accuracy and completeness, and I agree with the above.  Clayborne Artist MD

## 2020-03-25 NOTE — Patient Instructions (Signed)
Recommend daily broad spectrum sunscreen SPF 30+ to sun-exposed areas, reapply every 2 hours as needed. Call for new or changing lesions.  While taking Isotretinoin and for 30 days after you finish the medication, do not get pregnant, do not share pills, do not donate blood. Isotretinoin is best absorbed when taken with a fatty meal. Isotretinoin can make you sensitive to the sun. Daily careful sun protection including sunscreen SPF 30+ when outdoors is recommended.  Avoid applying to face, groin, and axilla. Use as directed. Risk of skin atrophy with long-term use reviewed.   Topical steroids (such as triamcinolone, fluocinolone, fluocinonide, mometasone, clobetasol, halobetasol, betamethasone, hydrocortisone) can cause thinning and lightening of the skin if they are used for too long in the same area. Your physician has selected the right strength medicine for your problem and area affected on the body. Please use your medication only as directed by your physician to prevent side effects.

## 2020-04-30 ENCOUNTER — Ambulatory Visit: Payer: No Typology Code available for payment source | Admitting: Dermatology

## 2020-05-05 ENCOUNTER — Other Ambulatory Visit: Payer: Self-pay

## 2020-05-05 ENCOUNTER — Ambulatory Visit (INDEPENDENT_AMBULATORY_CARE_PROVIDER_SITE_OTHER): Payer: No Typology Code available for payment source | Admitting: Dermatology

## 2020-05-05 VITALS — Wt 120.0 lb

## 2020-05-05 DIAGNOSIS — L853 Xerosis cutis: Secondary | ICD-10-CM | POA: Diagnosis not present

## 2020-05-05 DIAGNOSIS — Z79899 Other long term (current) drug therapy: Secondary | ICD-10-CM

## 2020-05-05 DIAGNOSIS — L700 Acne vulgaris: Secondary | ICD-10-CM | POA: Diagnosis not present

## 2020-05-05 DIAGNOSIS — K130 Diseases of lips: Secondary | ICD-10-CM

## 2020-05-05 NOTE — Progress Notes (Signed)
   Isotretinoin Follow-Up Visit   Subjective  Shelly Knight is a 19 y.o. female who presents for the following: Acne (face, Wk 20 Isotretinoin 40mg  1 po qd, last labs 11/09/2019).  Week # 20   Isotretinoin F/U - 05/05/20 1300      Isotretinoin Follow Up   iPledge # 05/07/20    Date 05/05/20    Weight 120 lb (54.4 kg)    Two Forms of Birth Control Female Condom      Dosage   Current (To Date) Dosage (mg) 40mg            Side effects: Dry skin, dry lips  Denies changes in night vision, shortness of breath, abdominal pain, nausea, vomiting, diarrhea, blood in stool or urine, visual changes, headaches, epistaxis, joint pain, myalgias, mood changes, depression, or suicidal ideation.   Patient is not pregnant, not seeking pregnancy, and not breastfeeding.   The following portions of the chart were reviewed this encounter and updated as appropriate: medications, allergies, medical history  Review of Systems:  No other skin or systemic complaints except as noted in HPI or Assessment and Plan.  Objective  Well appearing patient in no apparent distress; mood and affect are within normal limits.  An examination of the face, neck, chest, and back was performed and relevant findings are noted below.   Objective  Head - Anterior (Face): Few closed comedones cheeks and forehead, scarring cheeks   Assessment & Plan   Acne vulgaris Head - Anterior (Face)  Improved, with scarring Wk 20 Isotretinoin IPLEDGE # 05/07/20 2 forms BC oral contraceptive, female condoms [facility] Sunscreen qd  Pending labs cont Isotretinoin 40mg  1 po qd with fatty meal  Discussed will need 3 more months at 40 mg including this one to meet goal mg/kg dose  Other Related Procedures Comprehensive metabolic panel Lipid panel hCG, serum, qualitative   Xerosis - Continue emollients as directed  Cheilitis - Continue lip balm as directed, Dr. Cortibalm recommended  Long term  medication management (isotretinoin) - While taking Isotretinoin and for 30 days after you finish the medication, do not get pregnant, do not share pills, do not donate blood. Isotretinoin is best absorbed when taken with a fatty meal. Isotretinoin can make you sensitive to the sun. Daily careful sun protection including sunscreen SPF 30+ when outdoors is recommended.  Follow-up in 30 days.  I, [phone], RMA, am acting as scribe for , MD . Documentation: I have reviewed the above documentation for accuracy and completeness, and I agree with the above.  Clayborne Artist MD

## 2020-05-06 ENCOUNTER — Telehealth: Payer: Self-pay

## 2020-05-06 ENCOUNTER — Other Ambulatory Visit: Payer: Self-pay

## 2020-05-06 LAB — COMPREHENSIVE METABOLIC PANEL
ALT: 25 IU/L (ref 0–32)
AST: 24 IU/L (ref 0–40)
Albumin/Globulin Ratio: 1.8 (ref 1.2–2.2)
Albumin: 4.4 g/dL (ref 3.9–5.0)
Alkaline Phosphatase: 56 IU/L (ref 45–106)
BUN/Creatinine Ratio: 13 (ref 9–23)
BUN: 9 mg/dL (ref 6–20)
Bilirubin Total: 0.5 mg/dL (ref 0.0–1.2)
CO2: 24 mmol/L (ref 20–29)
Calcium: 9.8 mg/dL (ref 8.7–10.2)
Chloride: 101 mmol/L (ref 96–106)
Creatinine, Ser: 0.67 mg/dL (ref 0.57–1.00)
GFR calc Af Amer: 146 mL/min/{1.73_m2} (ref >59)
GFR calc non Af Amer: 127 mL/min/{1.73_m2} (ref >59)
Globulin, Total: 2.5 g/dL (ref 1.5–4.5)
Glucose: 91 mg/dL (ref 65–99)
Potassium: 4.6 mmol/L (ref 3.5–5.2)
Sodium: 140 mmol/L (ref 134–144)
Total Protein: 6.9 g/dL (ref 6.0–8.5)

## 2020-05-06 LAB — LIPID PANEL
Chol/HDL Ratio: 3 ratio (ref 0.0–4.4)
Cholesterol, Total: 172 mg/dL (ref 100–199)
HDL: 58 mg/dL (ref >39)
LDL Chol Calc (NIH): 102 mg/dL — ABNORMAL HIGH (ref 0–99)
Triglycerides: 59 mg/dL (ref 0–149)
VLDL Cholesterol Cal: 12 mg/dL (ref 5–40)

## 2020-05-06 LAB — HCG, SERUM, QUALITATIVE: hCG,Beta Subunit,Qual,Serum: NEGATIVE m[IU]/mL (ref <6)

## 2020-05-06 MED ORDER — ISOTRETINOIN 40 MG PO CAPS: 40.0000 mg | ORAL_CAPSULE | Freq: Every day | ORAL | 0 refills | Status: AC

## 2020-05-06 NOTE — Telephone Encounter (Signed)
Advised patient labs were ok and pregnancy test negative. Patient confirmed in iPLEDGE program and isotretinoin sent in to [facility]. Patient to demonstrate comprehension.

## 2020-06-09 ENCOUNTER — Other Ambulatory Visit: Payer: Self-pay

## 2020-06-09 ENCOUNTER — Ambulatory Visit: Payer: No Typology Code available for payment source | Admitting: Dermatology

## 2020-06-09 VITALS — Wt 120.0 lb

## 2020-06-09 DIAGNOSIS — Z79899 Other long term (current) drug therapy: Secondary | ICD-10-CM

## 2020-06-09 DIAGNOSIS — L700 Acne vulgaris: Secondary | ICD-10-CM | POA: Diagnosis not present

## 2020-06-09 DIAGNOSIS — L853 Xerosis cutis: Secondary | ICD-10-CM

## 2020-06-09 DIAGNOSIS — L739 Follicular disorder, unspecified: Secondary | ICD-10-CM | POA: Diagnosis not present

## 2020-06-09 DIAGNOSIS — K130 Diseases of lips: Secondary | ICD-10-CM | POA: Diagnosis not present

## 2020-06-09 MED ORDER — CLINDAMYCIN PHOSPHATE 1 % EX LOTN: TOPICAL_LOTION | CUTANEOUS | 3 refills | Status: AC

## 2020-06-09 MED ORDER — ISOTRETINOIN 40 MG PO CAPS: 40.0000 mg | ORAL_CAPSULE | Freq: Every day | ORAL | 0 refills | Status: AC

## 2020-06-09 NOTE — Progress Notes (Signed)
   Isotretinoin Follow-Up Visit  Subjective  Shelly Knight is a 19 y.o. female who presents for the following: Acne (face, wk 24 Isotretinoin, Isotretinoin 40mg  qd, last labs 05/05/20).  Week # 24   Isotretinoin F/U - 06/09/20 1400      Isotretinoin Follow Up   iPledge # 06/11/20    Date 06/09/20    Weight 120 lb (54.4 kg)    Two Forms of Birth Control Female Condom;Oral Contraceptives (w/ estrogen)    Acne breakouts since last visit? No      Dosage   Current (To Date) Dosage (mg) 40mg       Side Effects   Skin Chapped Lips    Gastrointestinal WNL    Neurological WNL    Constitutional WNL           Side effects: Dry skin, dry lips  Denies changes in night vision, shortness of breath, abdominal pain, nausea, vomiting, diarrhea, blood in stool or urine, visual changes, headaches, epistaxis, joint pain, myalgias, mood changes, depression, or suicidal ideation.   Patient is not pregnant, not seeking pregnancy, and not breastfeeding.   The following portions of the chart were reviewed this encounter and updated as appropriate: medications, allergies, medical history  Review of Systems:  No other skin or systemic complaints except as noted in HPI or Assessment and Plan.  Objective  Well appearing patient in no apparent distress; mood and affect are within normal limits.  An examination of the face, neck, chest, and back was performed and relevant findings are noted below.   Objective  Face: Scaling of lips, face clear Labs reviewed (from 1 mo ago) - all normal  Objective  bil lower buttocks: Follicular paps lower buttocks bil   Assessment & Plan   Acne vulgaris Face  Wk #24- improving IPLEDGE # 06/11/20 Oral contraceptive/Female latex condoms [facility]  Discussed 2 more months of treatment to meet mg/kg dose  Cont Isotretinoin 40mg  1 po qd with fatty meal #30,  Urine pregnancy test performed in office today and was negative.  Patient  demonstrates comprehension and confirms she will not get pregnant.    Patient confirmed in iPledge and isotretinoin sent to pharmacy.   ISOtretinoin (ACCUTANE) 40 MG capsule - Face  Folliculitis bil lower buttocks  Start Clindamycin lotion qd/bid  clindamycin (CLEOCIN-T) 1 % lotion - bil lower buttocks   Xerosis - Continue emollients as directed  Cheilitis - Continue lip balm as directed, Dr. [phone] Cortibalm recommended  Long term medication management (isotretinoin) - While taking Isotretinoin and for 30 days after you finish the medication, do not get pregnant, do not share pills, do not donate blood. Isotretinoin is best absorbed when taken with a fatty meal. Isotretinoin can make you sensitive to the sun. Daily careful sun protection including sunscreen SPF 30+ when outdoors is recommended.  Follow-up in 30 days.  I, , CMA, am acting as scribe for 0FE, MD .  I, Clayborne Artist, RMA, am acting as scribe for Cherlyn Labella, MD . Documentation: I have reviewed the above documentation for accuracy and completeness, and I agree with the above.  Willeen Niece MD

## 2020-06-09 NOTE — Patient Instructions (Signed)
While taking Isotretinoin and for 30 days after you finish the medication, do not get pregnant, do not share pills, do not donate blood. Isotretinoin is best absorbed when taken with a fatty meal. Isotretinoin can make you sensitive to the sun. Daily careful sun protection including sunscreen SPF 30+ when outdoors is recommended.  

## 2020-07-15 ENCOUNTER — Ambulatory Visit: Payer: No Typology Code available for payment source | Admitting: Dermatology

## 2020-07-23 ENCOUNTER — Ambulatory Visit: Payer: No Typology Code available for payment source | Admitting: Dermatology

## 2020-08-26 ENCOUNTER — Other Ambulatory Visit: Payer: Self-pay

## 2020-08-26 ENCOUNTER — Encounter: Payer: Self-pay | Admitting: Dermatology

## 2020-08-26 ENCOUNTER — Ambulatory Visit (INDEPENDENT_AMBULATORY_CARE_PROVIDER_SITE_OTHER): Payer: No Typology Code available for payment source | Admitting: Dermatology

## 2020-08-26 DIAGNOSIS — L739 Follicular disorder, unspecified: Secondary | ICD-10-CM

## 2020-08-26 DIAGNOSIS — L700 Acne vulgaris: Secondary | ICD-10-CM

## 2020-08-26 MED ORDER — DOXYCYCLINE MONOHYDRATE 100 MG PO CAPS: 100.0000 mg | ORAL_CAPSULE | Freq: Two times a day (BID) | ORAL | 0 refills | Status: AC

## 2020-08-26 NOTE — Progress Notes (Signed)
   Follow-Up Visit   Subjective  Shelly Knight is a 19 y.o. female who presents for the following: Follow-up   Patient here today for rash on bilateral lower buttocks. She was last seen on 9/27 and treated with clindamycin 1% lotion to apply on affected area. She states she doesn't feel medication was strong enough to treat area and therefore rash is not resolved.   She is also concerned about persistent acne and would like to continue her treatment with isotretinoin.  The following portions of the chart were reviewed this encounter and updated as appropriate:  Tobacco  Allergies  Meds  Problems  Med Hx  Surg Hx  Fam Hx       Objective  Well appearing patient in no apparent distress; mood and affect are within normal limits.  A focused examination was performed including face, neck, chest, buttocks, and back. Relevant physical exam findings are noted in the Assessment and Plan.  Objective  bilateral lower buttock: Scattered inflammatory papules and pustules at buttocks  Objective  Head - Anterior (Face): Few inflammatory papules at face  Chest - clear Back - clear  Buttocks with scattered inflammatory papules  Assessment & Plan  Folliculitis bilateral lower buttock  Currently using clindamycin 1 % lotion without relief - told to discontinue   Start doxycycline 100 mg twice a day for 14 days.  Doxycycline should be taken with food to prevent nausea. Do not lay down for 30 minutes after taking. Be cautious with sun exposure and use good sun protection while on this medication. Pregnant women should not take this medication. Do not take together with isotretinoin.  If this does not clear with doxycycline, can try Cln Sportwash or Pyrithione zinc shampoo as body wash. Also expect improvement/clearance with isotretinoin once restarted.    Ordered Medications: doxycycline (MONODOX) 100 MG capsule  Other Related Medications clindamycin (CLEOCIN-T) 1 %  lotion  Acne vulgaris Head - Anterior (Face)  Pt with chronic refractory acne despite multiple courses of different treatments including pill antibiotics .  On isotretinoin and not at goal but came off for 2 months as she was unable to follow-up. Will restart isotretinoin to complete her course. Advised she cannot take this at the same time as doxycycline. Must wait at least 5 days after taking doxycycline before starting isotretinoin.  Chronic condition, not at goal, restart isotretinoin after 30 day waiting period.   Urine pregnancy test performed in office today and was negative.  Patient demonstrates comprehension and confirms she will not get pregnant.   Reviewed potential side effects of isotretinoin including xerosis, cheilitis, hepatitis, hyperlipidemia, and severe birth defects if taken by a pregnant woman. Reviewed reports of suicidal ideation in those with a history of depression while taking isotretinoin and reports of diagnosis of inflammatory bowl disease while taking isotretinoin as well as the lack of evidence for a causal relationship between isotretinoin, depression and IBD. Patient advised to reach out with any questions or concerns. Patient advised not to share pills or donate blood while on treatment or for one month after completing treatment.   Ordered Medications: doxycycline (MONODOX) 100 MG capsule  Return in about 1 month (around 09/26/2020) for Accutane Followup .  I, Asher Muir, CMA, am acting as scribe for Darden Dates, MD.  Documentation: I have reviewed the above documentation for accuracy and completeness, and I agree with the above.  Darden Dates, MD

## 2020-08-26 NOTE — Patient Instructions (Signed)
Doxycycline should be taken with food to prevent nausea. Do not lay down for 30 minutes after taking. Be cautious with sun exposure and use good sun protection while on this medication. Pregnant women should not take this medication.    Reviewed potential side effects of isotretinoin including xerosis, cheilitis, hepatitis, hyperlipidemia, and severe birth defects if taken by a pregnant woman. Reviewed reports of suicidal ideation in those with a history of depression while taking isotretinoin and reports of diagnosis of inflammatory bowl disease while taking isotretinoin as well as the lack of evidence for a causal relationship between isotretinoin, depression and IBD. Patient advised to reach out with any questions or concerns. Patient advised not to share pills or donate blood while on treatment or for one month after completing treatment.   DO NOT TAKE DOXYCYCLINE AND ISOTRETINOIN TOGETHER.

## 2020-09-30 ENCOUNTER — Ambulatory Visit (INDEPENDENT_AMBULATORY_CARE_PROVIDER_SITE_OTHER): Payer: No Typology Code available for payment source | Admitting: Dermatology

## 2020-09-30 ENCOUNTER — Other Ambulatory Visit: Payer: Self-pay

## 2020-09-30 ENCOUNTER — Telehealth: Payer: Self-pay

## 2020-09-30 DIAGNOSIS — L700 Acne vulgaris: Secondary | ICD-10-CM

## 2020-09-30 MED ORDER — DOXYCYCLINE HYCLATE 20 MG PO TABS: 20.0000 mg | ORAL_TABLET | Freq: Two times a day (BID) | ORAL | 0 refills | Status: AC

## 2020-09-30 MED ORDER — CLINDAMYCIN PHOSPHATE 1 % EX SOLN: CUTANEOUS | 1 refills | Status: DC

## 2020-09-30 NOTE — Telephone Encounter (Signed)
Pt in the office today to get started on Isotretinoin, pt was not registered at last office visit, registered pt today in the office in Ipledge, In office Urine pregnancy test negative  Pt will have to wait 30 days before she can start Isotretinoin, 2 forms of birth control- birth control tablets, female latex condoms. Email address- [email or web address]    start Doxycycline 20 mg take 1 tablet po bid, stop Doxy 5 days before starting Isotretinoin, start Clindamycin solution apply to face bid as needed,  Pt tolerated Isotretinoin well in the past, pt on Zoloft with a good response, pt last visit with her behavior health doctor was in 2020, pt has not had any depression or mood issues. We will monitor closely while on Isotretinoin

## 2020-10-06 NOTE — Progress Notes (Signed)
Note entered in error

## 2020-11-03 ENCOUNTER — Ambulatory Visit: Payer: No Typology Code available for payment source | Admitting: Dermatology

## 2020-11-05 ENCOUNTER — Other Ambulatory Visit: Payer: Self-pay

## 2020-11-05 ENCOUNTER — Encounter: Payer: Self-pay | Admitting: Dermatology

## 2020-11-05 ENCOUNTER — Ambulatory Visit (INDEPENDENT_AMBULATORY_CARE_PROVIDER_SITE_OTHER): Payer: No Typology Code available for payment source | Admitting: Dermatology

## 2020-11-05 DIAGNOSIS — L700 Acne vulgaris: Secondary | ICD-10-CM | POA: Diagnosis not present

## 2020-11-05 DIAGNOSIS — L858 Other specified epidermal thickening: Secondary | ICD-10-CM

## 2020-11-05 DIAGNOSIS — K130 Diseases of lips: Secondary | ICD-10-CM | POA: Diagnosis not present

## 2020-11-05 MED ORDER — HYDROCORTISONE 2.5 % EX OINT: TOPICAL_OINTMENT | Freq: Two times a day (BID) | CUTANEOUS | 1 refills | Status: DC

## 2020-11-05 MED ORDER — ISOTRETINOIN 40 MG PO CAPS: 40.0000 mg | ORAL_CAPSULE | Freq: Every day | ORAL | 0 refills | Status: DC

## 2020-11-05 NOTE — Progress Notes (Signed)
   Follow-Up Visit   Subjective  Shelly Knight is a 20 y.o. female who presents for the following: Acne (Patient here today to restart accutane. She is currently not using anything to treat acne. ).  She was taking doxycycline and using clindamycin solution. Patient was re-registered in iPledge program on 09/30/20.  [facility] Oral BC pills/condoms  The following portions of the chart were reviewed this encounter and updated as appropriate:   Tobacco  Allergies  Meds  Problems  Med Hx  Surg Hx  Fam Hx      Review of Systems:  No other skin or systemic complaints except as noted in HPI or Assessment and Plan.  Objective  Well appearing patient in no apparent distress; mood and affect are within normal limits.  A focused examination was performed including face, neck, chest and back and arms. Relevant physical exam findings are noted in the Assessment and Plan.  Objective  Head - Anterior (Face): Few inflammatory papules, trace open comedones  Objective  Bilateral arms: Tiny follicular keratotic papules.    Assessment & Plan  Acne vulgaris Head - Anterior (Face)  Acne is chronic, significantly bothersome to patient, not at goal.   She has been off of doxycycline for over 5 days.   Restart Absorica 40mg  1 PO QD  While taking Isotretinoin and for 30 days after you finish the medication, do not get pregnant, do not share pills, do not donate blood. Isotretinoin is best absorbed when taken with a fatty meal. Isotretinoin can make you sensitive to the sun. Daily careful sun protection including sunscreen SPF 30+ when outdoors is recommended.  Urine pregnancy test performed in office today and was negative.  Patient demonstrates comprehension and confirms she will not get pregnant.   Patient confirmed in iPledge and isotretinoin sent to pharmacy.  Ordered Medications: ISOtretinoin (ABSORICA) 40 MG capsule  Keratosis pilaris Bilateral arms  Recommend ammonium  lactate daily as needed.   Cheilitis  Reordered Medications hydrocortisone 2.5 % ointment  Other Related Medications hydrocortisone 2.5 % cream  Return in about 30 days (around 12/05/2020) for Isotretinoin.  12/07/2020, RMA, am acting as scribe for Anise Salvo, MD .  Documentation: I have reviewed the above documentation for accuracy and completeness, and I agree with the above.  Darden Dates, MD

## 2020-11-05 NOTE — Patient Instructions (Addendum)
Recommend ammonium lactate daily to arms as needed.   While taking Isotretinoin and for 30 days after you finish the medication, do not get pregnant, do not share pills, do not donate blood. Isotretinoin is best absorbed when taken with a fatty meal. Isotretinoin can make you sensitive to the sun. Daily careful sun protection including sunscreen SPF 30+ when outdoors is recommended.

## 2020-12-05 ENCOUNTER — Ambulatory Visit: Payer: Self-pay | Admitting: *Deleted

## 2020-12-05 NOTE — Telephone Encounter (Signed)
FYI pt is scheduled for Monday as np

## 2020-12-05 NOTE — Telephone Encounter (Signed)
Call to patient- patient states she has painful urination, fever-not sure temperature, R side pain,. Advised UC as soon as possible. Reason for Disposition . Fever > 100.4 F (38.0 C)    Patient states she had fever last night- did not check- but felt bad at bedtime.  Answer Assessment - Initial Assessment Questions 1. SYMPTOM: "What's the main symptom you're concerned about?" (e.g., frequency, incontinence)     Pain with urination, odor 2. ONSET: "When did the  pain  start?"     5-6 days 3. PAIN: "Is there any pain?" If Yes, ask: "How bad is it?" (Scale: 1-10; mild, moderate, severe)     Yes- 9 4. CAUSE: "What do you think is causing the symptoms?"     Possible UTI 5. OTHER SYMPTOMS: "Do you have any other symptoms?" (e.g., fever, flank pain, blood in urine, pain with urination)    R side pain, fever- last night 6. PREGNANCY: "Is there any chance you are pregnant?" "When was your last menstrual period?"     No- LMP- 2 weeks ago  Protocols used: URINARY [facility]

## 2020-12-05 NOTE — Telephone Encounter (Signed)
Summary: Call back request (possible kidney infection)    Pt has a possible kidney infection, scheduled for new patient appt at [facility] on Monday. Wants advice for now      Attempted to contact patient- left message on number provided- may be mother's number.

## 2020-12-08 ENCOUNTER — Ambulatory Visit: Payer: Self-pay | Admitting: Nurse Practitioner

## 2020-12-08 NOTE — Progress Notes (Deleted)
   There were no vitals taken for this visit.   Subjective:    Patient ID: Shelly Knight, female    DOB: [birth year], 20 y.o.   MRN: [id number]  HPI: [name] is a 20 y.o. female  No chief complaint on file.  DEPRESSION Mood status: {Blank single:19197::"controlled","uncontrolled","better","worse","exacerbated","stable"} Satisfied with current treatment?: {Blank single:19197::"yes","no"} Symptom severity: {Blank single:19197::"mild","moderate","severe"}  Duration of current treatment : {Blank single:19197::"chronic","months","years"} Side effects: {Blank single:19197::"yes","no"} Medication compliance: {Blank single:19197::"excellent compliance","good compliance","fair compliance","poor compliance"} Psychotherapy/counseling: {Blank single:19197::"yes","no"} {Blank single:19197::"current","in the past"} Previous psychiatric medications: {Blank multiple:19196::"abilify","amitryptiline","buspar","celexa","cymbalta","depakote","effexor","lamictal","lexapro","lithium","nortryptiline","paxil","prozac","pristiq (desvenlafaxine","seroquel","wellbutrin","zoloft","zyprexa"} Depressed mood: {Blank single:19197::"yes","no"} Anxious mood: {Blank single:19197::"yes","no"} Anhedonia: {Blank single:19197::"yes","no"} Significant weight loss or gain: {Blank single:19197::"yes","no"} Insomnia: {Blank single:19197::"yes","no"} {Blank single:19197::"hard to fall asleep","hard to stay asleep"} Fatigue: {Blank single:19197::"yes","no"} Feelings of worthlessness or guilt: {Blank single:19197::"yes","no"} Impaired concentration/indecisiveness: {Blank single:19197::"yes","no"} Suicidal ideations: {Blank single:19197::"yes","no"} Hopelessness: {Blank single:19197::"yes","no"} Crying spells: {Blank single:19197::"yes","no"} No flowsheet data found.    Relevant past medical, surgical, family and social history reviewed and updated as indicated. Interim medical history since our last visit  reviewed. Allergies and medications reviewed and updated.  Review of Systems  Per HPI unless specifically indicated above     Objective:    There were no vitals taken for this visit.  Wt Readings from Last 3 Encounters:  06/09/20 120 lb (54.4 kg)  05/05/20 120 lb (54.4 kg)  03/25/20 120 lb (54.4 kg)    Physical Exam  Results for orders placed or performed in visit on 05/05/20  Comprehensive metabolic panel  Result Value Ref Range   Glucose 91 65 - 99 mg/dL   BUN 9 6 - 20 mg/dL   Creatinine, Ser 7.90 0.57 - 1.00 mg/dL   GFR calc non Af Amer 127 >59 mL/min/1.73   GFR calc Af Amer 146 >59 mL/min/1.73   BUN/Creatinine Ratio 13 9 - 23   Sodium 140 134 - 144 mmol/L   Potassium 4.6 3.5 - 5.2 mmol/L   Chloride 101 96 - 106 mmol/L   CO2 24 20 - 29 mmol/L   Calcium 9.8 8.7 - 10.2 mg/dL   Total Protein 6.9 6.0 - 8.5 g/dL   Albumin 4.4 3.9 - 5.0 g/dL   Globulin, Total 2.5 1.5 - 4.5 g/dL   Albumin/Globulin Ratio 1.8 1.2 - 2.2   Bilirubin Total 0.5 0.0 - 1.2 mg/dL   Alkaline Phosphatase 56 45 - 106 IU/L   AST 24 0 - 40 IU/L   ALT 25 0 - 32 IU/L  Lipid panel  Result Value Ref Range   Cholesterol, Total 172 100 - 199 mg/dL   Triglycerides 59 0 - 149 mg/dL   HDL 58 >24 mg/dL   VLDL Cholesterol Cal 12 5 - 40 mg/dL   LDL Chol Calc (NIH) 097 (H) 0 - 99 mg/dL   Chol/HDL Ratio 3.0 0.0 - 4.4 ratio  hCG, serum, qualitative  Result Value Ref Range   hCG,Beta Subunit,Qual,Serum Negative Negative <6 mIU/mL      Assessment & Plan:   Problem List Items Addressed This Visit      Other   Generalized anxiety disorder   Major depressive disorder, single episode, moderate (HCC) - Primary    Other Visit Diagnoses    Encounter to establish care           Follow up plan: No follow-ups on file.

## 2020-12-11 ENCOUNTER — Other Ambulatory Visit: Payer: Self-pay

## 2020-12-11 ENCOUNTER — Encounter: Payer: Self-pay | Admitting: Dermatology

## 2020-12-11 ENCOUNTER — Ambulatory Visit (INDEPENDENT_AMBULATORY_CARE_PROVIDER_SITE_OTHER): Payer: No Typology Code available for payment source | Admitting: Dermatology

## 2020-12-11 VITALS — Wt 120.0 lb

## 2020-12-11 DIAGNOSIS — L700 Acne vulgaris: Secondary | ICD-10-CM

## 2020-12-11 DIAGNOSIS — L853 Xerosis cutis: Secondary | ICD-10-CM

## 2020-12-11 DIAGNOSIS — K130 Diseases of lips: Secondary | ICD-10-CM | POA: Diagnosis not present

## 2020-12-11 DIAGNOSIS — Z79899 Other long term (current) drug therapy: Secondary | ICD-10-CM | POA: Diagnosis not present

## 2020-12-11 NOTE — Patient Instructions (Signed)
While taking Isotretinoin and for 30 days after you finish the medication, do not get pregnant, do not share pills, do not donate blood. Isotretinoin is best absorbed when taken with a fatty meal. Isotretinoin can make you sensitive to the sun. Daily careful sun protection including sunscreen SPF 30+ when outdoors is recommended.  If you have any questions or concerns for your doctor, please call our main line at [phone] and press option 4 to reach your doctor's medical assistant. If no one answers, please leave a voicemail as directed and we will return your call as soon as possible. Messages left after 4 pm will be answered the following business day.   You may also send us a message via MyChart. We typically respond to MyChart messages within 1-2 business days.  For prescription refills, please ask your pharmacy to contact our office. Our fax number is [phone].  If you have an urgent issue when the clinic is closed that cannot wait until the next business day, you can page your doctor at the number below.    Please note that while we do our best to be available for urgent issues outside of office hours, we are not available 24/7.   If you have an urgent issue and are unable to reach us, you may choose to seek medical care at your doctor's office, retail clinic, urgent care center, or emergency room.  If you have a medical emergency, please immediately call 911 or go to the emergency department.  Pager Numbers  - Dr. Kowalski: [phone]  - Dr. Moye: [phone]  - Dr. Stewart: [phone]  In the event of inclement weather, please call our main line at [phone] for an update on the status of any delays or closures.  Dermatology Medication Tips: Please keep the boxes that topical medications come in in order to help keep track of the instructions about where and how to use these. Pharmacies typically print the medication instructions only on the boxes and not directly  on the medication tubes.   If your medication is too expensive, please contact our office at [phone] option 4 or send us a message through MyChart.   We are unable to tell what your co-pay for medications will be in advance as this is different depending on your insurance coverage. However, we may be able to find a substitute medication at lower cost or fill out paperwork to get insurance to cover a needed medication.   If a prior authorization is required to get your medication covered by your insurance company, please allow us 1-2 business days to complete this process.  Drug prices often vary depending on where the prescription is filled and some pharmacies may offer cheaper prices.  The website www.goodrx.com contains coupons for medications through different pharmacies. The prices here do not account for what the cost may be with help from insurance (it may be cheaper with your insurance), but the website can give you the price if you did not use any insurance.  - You can print the associated coupon and take it with your prescription to the pharmacy.  - You may also stop by our office during regular business hours and pick up a GoodRx coupon card.  - If you need your prescription sent electronically to a different pharmacy, notify our office through [facility] MyChart or by phone at [phone] option 4.  

## 2020-12-11 NOTE — Progress Notes (Signed)
   Isotretinoin Follow-Up Visit   Subjective  Shelly Knight is a 20 y.o. female who presents for the following: Acne (Patient here today for 30 day acne follow up. Patient restarted isotretinoin and has been taking 40mg  daily. ).  Week # 4 [facility] Oral BC pills, condoms   Isotretinoin F/U - 12/11/20 1400      Isotretinoin Follow Up   Date 12/11/20    Weight 120 lb (54.4 kg)    Two Forms of Birth Control Oral Contraceptives (w/ estrogen);Female Condom    Acne breakouts since last visit? Yes      Dosage   Current (To Date) Dosage (mg) 40mg       Side Effects   Skin Chapped Lips;Dry Lips    Gastrointestinal WNL    Neurological WNL    Constitutional WNL           Side effects: Dry skin, dry lips  Denies changes in night vision, shortness of breath, abdominal pain, nausea, vomiting, diarrhea, blood in stool or urine, visual changes, headaches, epistaxis, joint pain, myalgias, mood changes, depression, or suicidal ideation.   Patient is not pregnant, not seeking pregnancy, and not breastfeeding.   The following portions of the chart were reviewed this encounter and updated as appropriate: medications, allergies, medical history  Review of Systems:  No other skin or systemic complaints except as noted in HPI or Assessment and Plan.  Objective  Well appearing patient in no apparent distress; mood and affect are within normal limits.  An examination of the face, neck, chest, and back was performed and relevant findings are noted below.   Objective  Left Ear: Trace open comedones, rare  inflammatory papule at the face Rare inflammatory papule at back   Assessment & Plan   Acne vulgaris Left Ear  Pending normal labs, continue Abosrica increasing to 30mg  2 PO QD  Acne is chronic, severe, not at goal.  Patient with stable mood.  While taking Isotretinoin and for 30 days after you finish the medication, do not get pregnant, do not share pills, do not donate  blood. Isotretinoin is best absorbed when taken with a fatty meal. Isotretinoin can make you sensitive to the sun. Daily careful sun protection including sunscreen SPF 30+ when outdoors is recommended.   Other Related Procedures Comprehensive metabolic panel Lipid panel hCG, serum, qualitative  Other Related Medications ISOtretinoin (ABSORICA) 40 MG capsule   Xerosis secondary to isotretinoin therapy - Continue emollients as directed  Cheilitis secondary to isotretinoin therapy - Continue lip balm as directed, Dr. 12/13/20 Cortibalm recommended  Long term medication management (isotretinoin) - While taking Isotretinoin and for 30 days after you finish the medication, do not get pregnant, do not share pills, do not donate blood. Isotretinoin is best absorbed when taken with a fatty meal. Isotretinoin can make you sensitive to the sun. Daily careful sun protection including sunscreen SPF 30+ when outdoors is recommended.  Follow-up in 30 days.  , RMA, am acting as scribe for , MD .  Documentation: I have reviewed the above documentation for accuracy and completeness, and I agree with the above.  Clayborne Artist, MD

## 2020-12-17 ENCOUNTER — Telehealth: Payer: Self-pay

## 2020-12-17 DIAGNOSIS — L700 Acne vulgaris: Secondary | ICD-10-CM

## 2020-12-17 LAB — COMPREHENSIVE METABOLIC PANEL
ALT: 67 IU/L — ABNORMAL HIGH (ref 0–32)
AST: 39 IU/L (ref 0–40)
Albumin/Globulin Ratio: 1.7 (ref 1.2–2.2)
Albumin: 4.7 g/dL (ref 3.9–5.0)
Alkaline Phosphatase: 53 IU/L (ref 42–106)
BUN/Creatinine Ratio: 23 (ref 9–23)
BUN: 16 mg/dL (ref 6–20)
Bilirubin Total: 0.6 mg/dL (ref 0.0–1.2)
CO2: 19 mmol/L — ABNORMAL LOW (ref 20–29)
Calcium: 9.7 mg/dL (ref 8.7–10.2)
Chloride: 100 mmol/L (ref 96–106)
Creatinine, Ser: 0.69 mg/dL (ref 0.57–1.00)
Globulin, Total: 2.8 g/dL (ref 1.5–4.5)
Glucose: 80 mg/dL (ref 65–99)
Potassium: 4.5 mmol/L (ref 3.5–5.2)
Sodium: 138 mmol/L (ref 134–144)
Total Protein: 7.5 g/dL (ref 6.0–8.5)
eGFR: 127 mL/min/{1.73_m2} (ref >59)

## 2020-12-17 LAB — LIPID PANEL
Chol/HDL Ratio: 3.4 ratio (ref 0.0–4.4)
Cholesterol, Total: 192 mg/dL (ref 100–199)
HDL: 56 mg/dL (ref >39)
LDL Chol Calc (NIH): 123 mg/dL — ABNORMAL HIGH (ref 0–99)
Triglycerides: 72 mg/dL (ref 0–149)
VLDL Cholesterol Cal: 13 mg/dL (ref 5–40)

## 2020-12-17 LAB — HCG, SERUM, QUALITATIVE: hCG,Beta Subunit,Qual,Serum: NEGATIVE m[IU]/mL (ref <6)

## 2020-12-17 MED ORDER — ISOTRETINOIN 40 MG PO CAPS: 40.0000 mg | ORAL_CAPSULE | Freq: Every day | ORAL | 0 refills | Status: DC

## 2020-12-17 NOTE — Telephone Encounter (Signed)
-----   Message from Sandi Mealy, MD sent at 12/17/2020  9:40 AM EDT ----- Labs notable for ALT (liver test) elevated at 67. Ok to continue isotretinoin but hold at current treatment dose instead of increasing dose. Will recheck labs in 1 month. Recommend avoiding alcohol and tylenol.

## 2020-12-17 NOTE — Telephone Encounter (Signed)
Notified patient of lab results and other provider recommendations. She verbalized understanding and denied further questions at this time.  Patient confirmed in ipledge and refill sent to pharmacy.

## 2021-01-14 ENCOUNTER — Other Ambulatory Visit: Payer: Self-pay

## 2021-01-14 ENCOUNTER — Ambulatory Visit (INDEPENDENT_AMBULATORY_CARE_PROVIDER_SITE_OTHER): Payer: No Typology Code available for payment source | Admitting: Dermatology

## 2021-01-14 VITALS — Wt 123.0 lb

## 2021-01-14 DIAGNOSIS — Z79899 Other long term (current) drug therapy: Secondary | ICD-10-CM

## 2021-01-14 DIAGNOSIS — K130 Diseases of lips: Secondary | ICD-10-CM | POA: Diagnosis not present

## 2021-01-14 DIAGNOSIS — L700 Acne vulgaris: Secondary | ICD-10-CM | POA: Diagnosis not present

## 2021-01-14 DIAGNOSIS — L853 Xerosis cutis: Secondary | ICD-10-CM | POA: Diagnosis not present

## 2021-01-14 NOTE — Patient Instructions (Addendum)
Recommend taking Heliocare sun protection supplement daily in sunny weather for additional sun protection. For maximum protection on the sunniest days, you can take up to 2 capsules of regular Heliocare OR take 1 capsule of Heliocare Ultra. For prolonged exposure (such as a full day in the sun), you can repeat your dose of the supplement 4 hours after your first dose. Heliocare can be purchased at [facility] or at [email or web address].   Recommend daily broad spectrum sunscreen SPF 30+ to sun-exposed areas, reapply every 2 hours as needed. Call for new or changing lesions.  Staying in the shade or wearing long sleeves, sun glasses (UVA+UVB protection) and wide brim hats (4-inch brim around the entire circumference of the hat) are also recommended for sun protection.   While taking Isotretinoin and for 30 days after you finish the medication, do not get pregnant, do not share pills, do not donate blood. Isotretinoin is best absorbed when taken with a fatty meal. Isotretinoin can make you sensitive to the sun. Daily careful sun protection including sunscreen SPF 30+ when outdoors is recommended.  If you have any questions or concerns for your doctor, please call our main line at [phone] and press option 4 to reach your doctor's medical assistant. If no one answers, please leave a voicemail as directed and we will return your call as soon as possible. Messages left after 4 pm will be answered the following business day.   You may also send [place] a message via MyChart. We typically respond to MyChart messages within 1-2 business days.  For prescription refills, please ask your pharmacy to contact our office. Our fax number is [phone].  If you have an urgent issue when the clinic is closed that cannot wait until the next business day, you can page your doctor at the number below.    Please note that while we do our best to be available for urgent issues outside of office hours, we are not  available 24/7.   If you have an urgent issue and are unable to reach [place], you may choose to seek medical care at your doctor's office, retail clinic, urgent care center, or emergency room.  If you have a medical emergency, please immediately call 911 or go to the emergency department.  Pager Numbers  - Dr. Gwen Pounds: [phone]  - Dr. Neale Burly: [phone]  - Dr. Roseanne Reno: [phone]  In the event of inclement weather, please call our main line at [phone] for an update on the status of any delays or closures.  Dermatology Medication Tips: Please keep the boxes that topical medications come in in order to help keep track of the instructions about where and how to use these. Pharmacies typically print the medication instructions only on the boxes and not directly on the medication tubes.   If your medication is too expensive, please contact our office at [phone] option 4 or send [place] a message through MyChart.   We are unable to tell what your co-pay for medications will be in advance as this is different depending on your insurance coverage. However, we may be able to find a substitute medication at lower cost or fill out paperwork to get insurance to cover a needed medication.   If a prior authorization is required to get your medication covered by your insurance company, please allow [place] 1-2 business days to complete this process.  Drug prices often vary depending on where the prescription is filled and some pharmacies may offer cheaper prices.  The  website www.goodrx.com contains coupons for medications through different pharmacies. The prices here do not account for what the cost may be with help from insurance (it may be cheaper with your insurance), but the website can give you the price if you did not use any insurance.  - You can print the associated coupon and take it with your prescription to the pharmacy.  - You may also stop by our office during regular business hours  and pick up a GoodRx coupon card.  - If you need your prescription sent electronically to a different pharmacy, notify our office through [facility] or by phone at [phone] option 4.

## 2021-01-14 NOTE — Progress Notes (Signed)
   Isotretinoin Follow-Up Visit   Subjective  Shelly Knight is a 20 y.o. female who presents for the following: Follow-up (Patient here today for 30 day accutane follow up. ).  Week # 8 [facility] pills/condoms   Isotretinoin F/U - 01/14/21 1500      Isotretinoin Follow Up   Date 01/14/21    Weight 123 lb (55.8 kg)    Two Forms of Birth Control Oral Contraceptives (w/ estrogen);Female Condom    Acne breakouts since last visit? Yes      Dosage   Current (To Date) Dosage (mg) 40mg       Side Effects   Skin Chapped Lips;Dry Lips    Gastrointestinal WNL    Neurological WNL    Constitutional WNL           Side effects: Dry skin, dry lips  Denies changes in night vision, shortness of breath, abdominal pain, nausea, vomiting, diarrhea, blood in stool or urine, visual changes, headaches, epistaxis, joint pain, myalgias, mood changes, depression, or suicidal ideation.   Patient is not pregnant, not seeking pregnancy, and not breastfeeding.   The following portions of the chart were reviewed this encounter and updated as appropriate: medications, allergies, medical history  Review of Systems:  No other skin or systemic complaints except as noted in HPI or Assessment and Plan.  Objective  Well appearing patient in no apparent distress; mood and affect are within normal limits.  An examination of the face, neck, chest, and back was performed and relevant findings are noted below.   Objective  Head - Anterior (Face): Rare inflammatory papule at face Trace open comedones face and back   Assessment & Plan   Acne vulgaris Head - Anterior (Face)  Acne is chronic, severe, not at goal.  While taking Isotretinoin and for 30 days after you finish the medication, do not get pregnant, do not share pills, do not donate blood. Isotretinoin is best absorbed when taken with a fatty meal. Isotretinoin can make you sensitive to the sun. Daily careful sun protection  including sunscreen SPF 30+ when outdoors is recommended.  Pending labs, continue Absorica increasing to 30mg  2 PO QD #60 0RF  Pt reports mood has been stable, no concerns    Other Related Procedures Comprehensive metabolic panel Lipid panel hCG, serum, qualitative  Other Related Medications ISOtretinoin (ABSORICA) 40 MG capsule   Xerosis secondary to isotretinoin therapy - Continue emollients as directed  Cheilitis secondary to isotretinoin therapy - Continue lip balm as directed, Dr. Cortibalm recommended  Long term medication management (isotretinoin) - While taking Isotretinoin and for 30 days after you finish the medication, do not get pregnant, do not share pills, do not donate blood. Isotretinoin is best absorbed when taken with a fatty meal. Isotretinoin can make you sensitive to the sun. Daily careful sun protection including sunscreen SPF 30+ when outdoors is recommended.  Follow-up in 30 days.  , RMA, am acting as scribe for Clayborne Artist, MD .  Documentation: I have reviewed the above documentation for accuracy and completeness, and I agree with the above.  Anise Salvo, MD

## 2021-01-26 ENCOUNTER — Encounter: Payer: Self-pay | Admitting: Dermatology

## 2021-02-18 ENCOUNTER — Ambulatory Visit: Payer: No Typology Code available for payment source | Admitting: Dermatology

## 2021-04-22 ENCOUNTER — Ambulatory Visit: Payer: No Typology Code available for payment source | Admitting: Dermatology

## 2021-04-29 ENCOUNTER — Ambulatory Visit (INDEPENDENT_AMBULATORY_CARE_PROVIDER_SITE_OTHER): Payer: No Typology Code available for payment source | Admitting: Dermatology

## 2021-04-29 ENCOUNTER — Other Ambulatory Visit: Payer: Self-pay

## 2021-04-29 DIAGNOSIS — L700 Acne vulgaris: Secondary | ICD-10-CM | POA: Diagnosis not present

## 2021-04-29 MED ORDER — TRETINOIN 0.025 % EX CREA: TOPICAL_CREAM | Freq: Every day | CUTANEOUS | 2 refills | Status: DC

## 2021-04-29 MED ORDER — SPIRONOLACTONE 100 MG PO TABS: 100.0000 mg | ORAL_TABLET | Freq: Every day | ORAL | 0 refills | Status: DC

## 2021-04-29 MED ORDER — DOXYCYCLINE HYCLATE 20 MG PO TABS: 20.0000 mg | ORAL_TABLET | Freq: Two times a day (BID) | ORAL | 0 refills | Status: AC

## 2021-04-29 NOTE — Progress Notes (Signed)
Follow-Up Visit   Subjective  Shelly Knight is a 20 y.o. female who presents for the following: Acne (Patient here today for acne. She was on second course and had an issue with insurance and was unable to finish the course. She would like to get restarted. ).   The following portions of the chart were reviewed this encounter and updated as appropriate:   Tobacco  Allergies  Meds  Problems  Med Hx  Surg Hx  Fam Hx      Review of Systems:  No other skin or systemic complaints except as noted in HPI or Assessment and Plan.  Objective  Well appearing patient in no apparent distress; mood and affect are within normal limits.  A focused examination was performed including face, neck, chest and back. Relevant physical exam findings are noted in the Assessment and Plan.  Head - Anterior (Face) 1+ open comedones, few inflammatory papules at face Chest with rare inflammatory papule Back with scattered inflammatory papules   Assessment & Plan  Acne vulgaris Head - Anterior (Face)  Chronic condition with duration or expected duration over one year. Condition is bothersome to patient. Currently flared.  Patient completed 1 course of isotretinoin but has been unable to complete another due to insurance difficulty and difficulty making regular appointments.  Acne worse with menses  B/P 110/73  Start spironolactone 100mg  1 po qhs Start tretinoin 0.025% cream pea size amount to entire face.  Start doxycycline 20mg  twice daily x 1 month.   Doxycycline should be taken with food to prevent nausea. Do not lay down for 30 minutes after taking. Be cautious with sun exposure and use good sun protection while on this medication. Pregnant women should not take this medication.    Spironolactone can cause increased urination and cause blood pressure to decrease. Please watch for signs of lightheadedness and be cautious when changing position. It can sometimes cause breast tenderness  or an irregular period in premenopausal women. It can also increase potassium. The increase in potassium usually is not a concern unless you are taking other medicines that also increase potassium, so please be sure your doctor knows all of the other medications you are taking. This medication should not be taken by pregnant women.  This medicine should also not be taken together with sulfa drugs like Bactrim (trimethoprim/sulfamethexazole).   Topical retinoid medications like tretinoin/Retin-A, adapalene/Differin, tazarotene/Fabior, and Epiduo/Epiduo Forte can cause dryness and irritation when first started. Only apply a pea-sized amount to the entire affected area. Avoid applying it around the eyes, edges of mouth and creases at the nose. If you experience irritation, use a good moisturizer first and/or apply the medicine less often. If you are doing well with the medicine, you can increase how often you use it until you are applying every night. Be careful with sun protection while using this medication as it can make you sensitive to the sun. This medicine should not be used by pregnant women.    tretinoin (RETIN-A) 0.025 % cream - Head - Anterior (Face) Apply topically at bedtime.  doxycycline (PERIOSTAT) 20 MG tablet - Head - Anterior (Face) Take 1 tablet (20 mg total) by mouth 2 (two) times daily.  Related Medications spironolactone (ALDACTONE) 100 MG tablet Take 1 tablet (100 mg total) by mouth daily.  Return in about 2 weeks (around 05/13/2021) for BP check with nurse, 2 months with Dr. .  05/15/2021, RMA, am acting as scribe for Neale Burly, MD .  Documentation:  I have reviewed the above documentation for accuracy and completeness, and I agree with the above.  Forest Gleason, MD

## 2021-04-29 NOTE — Patient Instructions (Addendum)
Start spironolactone 100mg  at bedtime Start tretinoin 0.025% cream pea size amount to entire face.  Start doxycycline 20mg  twice daily with food.   Doxycycline should be taken with food to prevent nausea. Do not lay down for 30 minutes after taking. Be cautious with sun exposure and use good sun protection while on this medication. Pregnant women should not take this medication.   Spironolactone can cause increased urination and cause blood pressure to decrease. Please watch for signs of lightheadedness and be cautious when changing position. It can sometimes cause breast tenderness or an irregular period in premenopausal women. It can also increase potassium. The increase in potassium usually is not a concern unless you are taking other medicines that also increase potassium, so please be sure your doctor knows all of the other medications you are taking. This medication should not be taken by pregnant women.  This medicine should also not be taken together with sulfa drugs like Bactrim (trimethoprim/sulfamethexazole).   Topical retinoid medications like tretinoin/Retin-A, adapalene/Differin, tazarotene/Fabior, and Epiduo/Epiduo Forte can cause dryness and irritation when first started. Only apply a pea-sized amount to the entire affected area. Avoid applying it around the eyes, edges of mouth and creases at the nose. If you experience irritation, use a good moisturizer first and/or apply the medicine less often. If you are doing well with the medicine, you can increase how often you use it until you are applying every night. Be careful with sun protection while using this medication as it can make you sensitive to the sun. This medicine should not be used by pregnant women.   Wra

## 2021-05-04 ENCOUNTER — Encounter: Payer: Self-pay | Admitting: Dermatology

## 2021-05-13 ENCOUNTER — Ambulatory Visit: Payer: No Typology Code available for payment source

## 2021-06-04 ENCOUNTER — Other Ambulatory Visit: Payer: Self-pay | Admitting: Dermatology

## 2021-06-04 DIAGNOSIS — L700 Acne vulgaris: Secondary | ICD-10-CM

## 2021-06-25 ENCOUNTER — Ambulatory Visit: Payer: No Typology Code available for payment source | Admitting: Dermatology

## 2021-07-08 ENCOUNTER — Ambulatory Visit: Payer: No Typology Code available for payment source | Admitting: Dermatology

## 2021-07-22 ENCOUNTER — Ambulatory Visit (INDEPENDENT_AMBULATORY_CARE_PROVIDER_SITE_OTHER): Payer: No Typology Code available for payment source | Admitting: Dermatology

## 2021-07-22 ENCOUNTER — Other Ambulatory Visit: Payer: Self-pay

## 2021-07-22 DIAGNOSIS — L700 Acne vulgaris: Secondary | ICD-10-CM

## 2021-07-22 MED ORDER — SPIRONOLACTONE 100 MG PO TABS: 100.0000 mg | ORAL_TABLET | Freq: Every day | ORAL | 0 refills | Status: DC

## 2021-07-22 NOTE — Patient Instructions (Signed)
Reviewed potential side effects of isotretinoin including xerosis, cheilitis, hepatitis, hyperlipidemia, and severe birth defects if taken by a pregnant woman. Reviewed reports of suicidal ideation in those with a history of depression while taking isotretinoin and reports of diagnosis of inflammatory bowl disease while taking isotretinoin as well as the lack of evidence for a causal relationship between isotretinoin, depression and IBD. Patient advised to reach out with any questions or concerns. Patient advised not to share pills or donate blood while on treatment or for one month after completing treatment.     If you have any questions or concerns for your doctor, please call our main line at [phone] and press option 4 to reach your doctor's medical assistant. If no one answers, please leave a voicemail as directed and we will return your call as soon as possible. Messages left after 4 pm will be answered the following business day.   You may also send us a message via MyChart. We typically respond to MyChart messages within 1-2 business days.  For prescription refills, please ask your pharmacy to contact our office. Our fax number is [phone].  If you have an urgent issue when the clinic is closed that cannot wait until the next business day, you can page your doctor at the number below.    Please note that while we do our best to be available for urgent issues outside of office hours, we are not available 24/7.   If you have an urgent issue and are unable to reach us, you may choose to seek medical care at your doctor's office, retail clinic, urgent care center, or emergency room.  If you have a medical emergency, please immediately call 911 or go to the emergency department.  Pager Numbers  - Dr. Kowalski: [phone]  - Dr. Moye: [phone]  - Dr. Stewart: [phone]  In the event of inclement weather, please call our main line at [phone] for an update on the  status of any delays or closures.  Dermatology Medication Tips: Please keep the boxes that topical medications come in in order to help keep track of the instructions about where and how to use these. Pharmacies typically print the medication instructions only on the boxes and not directly on the medication tubes.   If your medication is too expensive, please contact our office at [phone] option 4 or send us a message through MyChart.   We are unable to tell what your co-pay for medications will be in advance as this is different depending on your insurance coverage. However, we may be able to find a substitute medication at lower cost or fill out paperwork to get insurance to cover a needed medication.   If a prior authorization is required to get your medication covered by your insurance company, please allow us 1-2 business days to complete this process.  Drug prices often vary depending on where the prescription is filled and some pharmacies may offer cheaper prices.  The website www.goodrx.com contains coupons for medications through different pharmacies. The prices here do not account for what the cost may be with help from insurance (it may be cheaper with your insurance), but the website can give you the price if you did not use any insurance.  - You can print the associated coupon and take it with your prescription to the pharmacy.  - You may also stop by our office during regular business hours and pick up a GoodRx coupon card.  - If you need your prescription   sent electronically to a different pharmacy, notify our office through [place] MyChart or by phone at [phone] option 4.  

## 2021-07-22 NOTE — Progress Notes (Signed)
   Follow-Up Visit   Subjective  Shelly Knight is a 20 y.o. female who presents for the following: Acne (Patient here today for 3 month acne follow  up. She is currently taking spironolactone 100mg  qhs, doxycycline 20mg  bid and using tretinoin 0.025% nightly. Patient advises acne has improved but she does flare with periods. ).  The following portions of the chart were reviewed this encounter and updated as appropriate:   Tobacco  Allergies  Meds  Problems  Med Hx  Surg Hx  Fam Hx      Review of Systems:  No other skin or systemic complaints except as noted in HPI or Assessment and Plan.  Objective  Well appearing patient in no apparent distress; mood and affect are within normal limits.  A focused examination was performed including face, neck, chest and back. Relevant physical exam findings are noted in the Assessment and Plan.  face 1+ open and closed comedones, many inflammatory papules favoring temples and forehead with some xerosis at face Chest and back clear   Assessment & Plan  Acne vulgaris face  Chronic condition with duration over one year. Condition is bothersome to patient. Currently flared despite doxycycline and spironolactone therapy.  Pt previously completed one round of accutane with recurrence. Did tolerate with no effect on her mood.  Patient would like to restart accutane.   Reviewed potential side effects of isotretinoin including xerosis, cheilitis, hepatitis, hyperlipidemia, and severe birth defects if taken by a pregnant woman. Reviewed reports of suicidal ideation in those with a history of depression while taking isotretinoin and reports of diagnosis of inflammatory bowl disease while taking isotretinoin as well as the lack of evidence for a causal relationship between isotretinoin, depression and IBD. Patient advised to reach out with any questions or concerns. Patient advised not to share pills or donate blood while on treatment or for one  month after completing treatment.  Continue spironolactone 100mg  once daily Continue doxycycline 20mg  twice daily with food; will stop this at least 5 days prior to starting isotretinoin Continue tretinoin 0.025% cream nightly as tolerated  BP 101/70 iPledge # [facility] pills/condoms Urine pregnancy test performed in office today and was negative.  Patient re-registered in iPledge program.   Pt with known depression, followed closely. Will confirm ok with current practitioner managing depression before restarting.  Related Medications tretinoin (RETIN-A) 0.025 % cream Apply topically at bedtime.  spironolactone (ALDACTONE) 100 MG tablet Take 1 tablet (100 mg total) by mouth daily.  Return in about 1 month (around 08/21/2021) for Isotretinoin.  , RMA, am acting as scribe for , MD .  Documentation: I have reviewed the above documentation for accuracy and completeness, and I agree with the above.  [id number], MD

## 2021-07-28 ENCOUNTER — Encounter: Payer: Self-pay | Admitting: Dermatology

## 2021-08-25 ENCOUNTER — Encounter: Payer: Self-pay | Admitting: Dermatology

## 2021-08-25 ENCOUNTER — Ambulatory Visit (INDEPENDENT_AMBULATORY_CARE_PROVIDER_SITE_OTHER): Payer: No Typology Code available for payment source | Admitting: Dermatology

## 2021-08-25 ENCOUNTER — Other Ambulatory Visit: Payer: Self-pay

## 2021-08-25 DIAGNOSIS — L700 Acne vulgaris: Secondary | ICD-10-CM

## 2021-08-25 DIAGNOSIS — Z79899 Other long term (current) drug therapy: Secondary | ICD-10-CM

## 2021-08-25 NOTE — Patient Instructions (Signed)

## 2021-08-25 NOTE — Progress Notes (Signed)
° °  Isotretinoin Follow-Up Visit   Subjective  Shelly Knight is a 20 y.o. female who presents for the following: Acne (4 weeks f/u on acne on her face, pt taking Spironolactone 100 mg tablets, pt here to get started on Isotretinoin therapy ).   Patient is not pregnant, not seeking pregnancy, and not breastfeeding.  The following portions of the chart were reviewed this encounter and updated as appropriate: medications, allergies, medical history  Review of Systems:  No other skin or systemic complaints except as noted in HPI or Assessment and Plan.  Objective  Well appearing patient in no apparent distress; mood and affect are within normal limits.  An examination of the face, neck, chest, and back was performed and relevant findings are noted below.   Scalp 1 + open comedones, scattered inflammatory papules on the face, back and chest with rare inflammatory papule    Assessment & Plan   Acne vulgaris Scalp  Chronic condition with duration or expected duration over one year. Condition is bothersome to patient. Not currently at goal.  Severe, chronic, recalcitrant. Today milder (though still active despite therapy) but last month was significantly flared despite aggressive therapy. Plan to restart isotretinoin (she has done well with this in the past with good response and no mood changes). However, she has a history of depression and passive SI with worsened depression as recently as October of this year. One month ago she reported doing well with regards to her mood. Today she reports she is feeling much better at this time. She reports taking the Zoloft daily and denies any SI.   At this time, given her history of tolerating isotretinoin without difficulty and seemingly well-controlled depression for the last month, I am generally comfortable with restarting the isotretinoin for her. However, will speak with Dr. Nemiah Commander who most recently saw her for her depressive episode in  October before restarting isotretinoin.   Also recommend patient follows-up regularly with her mental health care team both in general and particularly while on isotretinoin.   Will get labs today. Will wait until after speaking with Dr. Nemiah Commander to get urine pregnancy test.   Related Procedures Comprehensive metabolic panel Lipid panel  Related Medications tretinoin (RETIN-A) 0.025 % cream Apply topically at bedtime.  spironolactone (ALDACTONE) 100 MG tablet Take 1 tablet (100 mg total) by mouth daily.  Long term medication management (isotretinoin) - While taking Isotretinoin and for 30 days after you finish the medication, do not get pregnant, do not share pills, do not donate blood. Isotretinoin is best absorbed when taken with a fatty meal. Isotretinoin can make you sensitive to the sun. Daily careful sun protection including sunscreen SPF 30+ when outdoors is recommended.  Follow-up in 30 days.   I, Angelique Holm, CMA, am acting as scribe for Darden Dates, MD .   Documentation: I have reviewed the above documentation for accuracy and completeness, and I agree with the above.  Darden Dates, MD

## 2021-08-26 LAB — COMPREHENSIVE METABOLIC PANEL
ALT: 8 IU/L (ref 0–32)
AST: 13 IU/L (ref 0–40)
Albumin/Globulin Ratio: 2.3 — ABNORMAL HIGH (ref 1.2–2.2)
Albumin: 4.6 g/dL (ref 3.9–5.0)
Alkaline Phosphatase: 48 IU/L (ref 44–121)
BUN/Creatinine Ratio: 20 (ref 9–23)
BUN: 13 mg/dL (ref 6–20)
Bilirubin Total: 0.2 mg/dL (ref 0.0–1.2)
CO2: 23 mmol/L (ref 20–29)
Calcium: 9.6 mg/dL (ref 8.7–10.2)
Chloride: 101 mmol/L (ref 96–106)
Creatinine, Ser: 0.64 mg/dL (ref 0.57–1.00)
Globulin, Total: 2 g/dL (ref 1.5–4.5)
Glucose: 105 mg/dL — ABNORMAL HIGH (ref 70–99)
Potassium: 4.1 mmol/L (ref 3.5–5.2)
Sodium: 138 mmol/L (ref 134–144)
Total Protein: 6.6 g/dL (ref 6.0–8.5)
eGFR: 129 mL/min/{1.73_m2} (ref >59)

## 2021-08-26 LAB — LIPID PANEL
Chol/HDL Ratio: 2.4 ratio (ref 0.0–4.4)
Cholesterol, Total: 151 mg/dL (ref 100–199)
HDL: 62 mg/dL (ref >39)
LDL Chol Calc (NIH): 78 mg/dL (ref 0–99)
Triglycerides: 53 mg/dL (ref 0–149)
VLDL Cholesterol Cal: 11 mg/dL (ref 5–40)

## 2021-08-27 ENCOUNTER — Telehealth: Payer: Self-pay

## 2021-08-27 NOTE — Telephone Encounter (Signed)
Left pt message to call back for lab results and to give her Dr. Onnie Boer message below -  "I spoke with Dr. Nemiah Commander and she is ok with [place] starting isotretinoin for [facility]. Shelly Knight should come by for a nurse visit to have a urine pregnancy test done and we can start her at absorica LD 16 mg daily pending negative urine pregnancy test.   She also needs to call to get a follow-up with Dr. Nemiah Commander in the next month, and she has to keep that appointment in order for [place] to keep prescribing the isotretinoin. "

## 2021-08-27 NOTE — Telephone Encounter (Signed)
-----   Message from Sandi Mealy, MD sent at 08/26/2021  5:27 PM EST ----- I spoke with Dr. Nemiah Commander and she is ok with [place] starting isotretinoin for [facility]. Azha should come by for a nurse visit to have a urine pregnancy test done and we can start her at absorica LD 16 mg daily pending negative urine pregnancy test.  She also needs to call to get a follow-up with Dr. Nemiah Commander in the next month, and she has to keep that appointment in order for [place] to keep prescribing the isotretinoin.   Thank you!

## 2021-09-02 ENCOUNTER — Telehealth: Payer: Self-pay

## 2021-09-02 NOTE — Telephone Encounter (Signed)
Patient called to advise she may not be able to make it in today for urine pregnancy test. Patient was advised if she can't make it in today that she needs to come in next week so she doesn't go past her 60 days since being reregistered in iPledge.

## 2021-09-02 NOTE — Telephone Encounter (Signed)
Patient returning my call. Advised labs ok and Dr. Nemiah Commander ok with her starting isotretinoin. Patient advised she must schedule follow up with Dr. Nemiah Commander in the next month. Patient will come by office today to do urine pregnancy test.

## 2021-09-02 NOTE — Telephone Encounter (Signed)
-----   Message from Sandi Mealy, MD sent at 08/26/2021  5:27 PM EST ----- I spoke with Dr. Nemiah Commander and she is ok with [place] starting isotretinoin for [facility]. Azha should come by for a nurse visit to have a urine pregnancy test done and we can start her at absorica LD 16 mg daily pending negative urine pregnancy test.  She also needs to call to get a follow-up with Dr. Nemiah Commander in the next month, and she has to keep that appointment in order for [place] to keep prescribing the isotretinoin.   Thank you!

## 2021-09-08 ENCOUNTER — Telehealth: Payer: Self-pay

## 2021-09-08 MED ORDER — ABSORICA LD 16 MG PO CAPS: 1.0000 | ORAL_CAPSULE | Freq: Every day | ORAL | 0 refills | Status: DC

## 2021-09-08 NOTE — Telephone Encounter (Signed)
Patient came in today for urine pregnancy test.  Urine pregnancy test negative.   Patient confirmed in ipledge and required to demonstrate comprehension.   Patient advised to make follow up with Dr. Nemiah Commander.

## 2021-09-09 ENCOUNTER — Other Ambulatory Visit: Payer: Self-pay

## 2021-09-09 DIAGNOSIS — L700 Acne vulgaris: Secondary | ICD-10-CM

## 2021-09-09 MED ORDER — ABSORICA LD 16 MG PO CAPS: 1.0000 | ORAL_CAPSULE | Freq: Every day | ORAL | 0 refills | Status: DC

## 2021-09-09 NOTE — Progress Notes (Signed)
[facility] unable to get rx from suppliers. I called pt and discussed switching to [facility] pharmacy.

## 2021-09-10 ENCOUNTER — Telehealth: Payer: Self-pay

## 2021-09-10 NOTE — Telephone Encounter (Signed)
Absorica and Absorica LD plan exclusion. Which dose of Isotretinoin would you like sent to [facility] instead?

## 2021-09-10 NOTE — Telephone Encounter (Signed)
They should automatically substitute generic isotretinoin instead if covered. If that is not covered, ok to do claravis. Thank you!

## 2021-09-10 NOTE — Telephone Encounter (Signed)
Pharmacy called to switch to myorisan 20 mg

## 2021-09-29 ENCOUNTER — Ambulatory Visit: Payer: No Typology Code available for payment source | Admitting: Dermatology

## 2021-10-13 ENCOUNTER — Ambulatory Visit: Payer: No Typology Code available for payment source | Admitting: Dermatology

## 2021-10-14 ENCOUNTER — Ambulatory Visit (INDEPENDENT_AMBULATORY_CARE_PROVIDER_SITE_OTHER): Payer: No Typology Code available for payment source | Admitting: Dermatology

## 2021-10-14 ENCOUNTER — Other Ambulatory Visit: Payer: Self-pay

## 2021-10-14 VITALS — Wt 120.0 lb

## 2021-10-14 DIAGNOSIS — K130 Diseases of lips: Secondary | ICD-10-CM | POA: Diagnosis not present

## 2021-10-14 DIAGNOSIS — Z79899 Other long term (current) drug therapy: Secondary | ICD-10-CM | POA: Diagnosis not present

## 2021-10-14 DIAGNOSIS — L700 Acne vulgaris: Secondary | ICD-10-CM

## 2021-10-14 DIAGNOSIS — L853 Xerosis cutis: Secondary | ICD-10-CM | POA: Diagnosis not present

## 2021-10-14 MED ORDER — ABSORICA LD 32 MG PO CAPS: 1.0000 | ORAL_CAPSULE | Freq: Every day | ORAL | 0 refills | Status: DC

## 2021-10-14 MED ORDER — SPIRONOLACTONE 100 MG PO TABS
100.0000 mg | ORAL_TABLET | Freq: Every day | ORAL | 1 refills | Status: DC
Start: 2021-10-14 — End: 2021-11-17

## 2021-10-14 NOTE — Patient Instructions (Signed)
Spironolactone can cause increased urination and cause blood pressure to decrease. Please watch for signs of lightheadedness and be cautious when changing position. It can sometimes cause breast tenderness or an irregular period in premenopausal women. It can also increase potassium. The increase in potassium usually is not a concern unless you are taking other medicines that also increase potassium, so please be sure your doctor knows all of the other medications you are taking. This medication should not be taken by pregnant women.  This medicine should also not be taken together with sulfa drugs like Bactrim (trimethoprim/sulfamethexazole).   While taking Isotretinoin and for 30 days after you finish the medication, do not get pregnant, do not share pills, do not donate blood.  Generic isotretinoin is best absorbed when taken with a fatty meal. Isotretinoin can make you sensitive to the sun. Daily careful sun protection including sunscreen SPF 30+ when outdoors is recommended.  Recommend Serica moisturizing scar formula cream every night or [facility] brand or Mederma silicone scar sheet every night for the first year after a scar appears to help with scar remodeling if desired. Scars remodel on their own for a full year.  If You Need Anything After Your Visit  If you have any questions or concerns for your doctor, please call our main line at [phone] and press option 4 to reach your doctor's medical assistant. If no one answers, please leave a voicemail as directed and we will return your call as soon as possible. Messages left after 4 pm will be answered the following business day.   You may also send [place] a message via MyChart. We typically respond to MyChart messages within 1-2 business days.  For prescription refills, please ask your pharmacy to contact our office. Our fax number is [phone].  If you have an urgent issue when the clinic is closed that cannot wait until the next business  day, you can page your doctor at the number below.    Please note that while we do our best to be available for urgent issues outside of office hours, we are not available 24/7.   If you have an urgent issue and are unable to reach [place], you may choose to seek medical care at your doctor's office, retail clinic, urgent care center, or emergency room.  If you have a medical emergency, please immediately call 911 or go to the emergency department.  Pager Numbers  - Dr. Gwen Pounds: [phone]  - Dr. Neale Burly: [phone]  - Dr. Roseanne Reno: [phone]  In the event of inclement weather, please call our main line at [phone] for an update on the status of any delays or closures.  Dermatology Medication Tips: Please keep the boxes that topical medications come in in order to help keep track of the instructions about where and how to use these. Pharmacies typically print the medication instructions only on the boxes and not directly on the medication tubes.   If your medication is too expensive, please contact our office at [phone] option 4 or send [place] a message through MyChart.   We are unable to tell what your co-pay for medications will be in advance as this is different depending on your insurance coverage. However, we may be able to find a substitute medication at lower cost or fill out paperwork to get insurance to cover a needed medication.   If a prior authorization is required to get your medication covered by your insurance company, please allow [place] 1-2 business days to complete this  process.  Drug prices often vary depending on where the prescription is filled and some pharmacies may offer cheaper prices.  The website www.goodrx.com contains coupons for medications through different pharmacies. The prices here do not account for what the cost may be with help from insurance (it may be cheaper with your insurance), but the website can give you the price if you did not use any  insurance.  - You can print the associated coupon and take it with your prescription to the pharmacy.  - You may also stop by our office during regular business hours and pick up a GoodRx coupon card.  - If you need your prescription sent electronically to a different pharmacy, notify our office through [facility] or by phone at [phone] option 4.     Si Usted Necesita Algo Despus de Su Visita  Tambin puede enviarnos un mensaje a travs de Clinical cytogeneticist. Por lo general respondemos a los mensajes de MyChart en el transcurso de 1 a 2 das hbiles.  Para renovar recetas, por favor pida a su farmacia que se ponga en contacto con nuestra oficina. Annie Sable de fax es [place] [phone].  Si tiene un asunto urgente cuando la clnica est cerrada y que no puede esperar hasta el siguiente da hbil, puede llamar/localizar a su doctor(a) al nmero que aparece a continuacin.   Por favor, tenga en cuenta que aunque hacemos todo lo posible para estar disponibles para asuntos urgentes fuera del horario de [place], no estamos disponibles las 24 horas del da, los 7 [address]  [address] de la [place].   Si tiene un problema urgente y no puede comunicarse con nosotros, puede optar por buscar atencin mdica  en el consultorio de su doctor(a), en una clnica privada, en un centro de atencin urgente o en una sala de emergencias.  Si tiene Engineer, drilling, por favor llame inmediatamente al 911 o vaya a la sala de emergencias.  Nmeros de bper  - Dr. Gwen Pounds: [phone]  - Dra. Moye: [phone]  - Dra. Roseanne Reno: [phone]  En caso de inclemencias del [place], por favor llame a Lacy Duverney principal al [phone] para una actualizacin sobre el [place] de cualquier retraso o cierre.  Consejos para la medicacin en dermatologa: Por favor, guarde las cajas en las que vienen los medicamentos de uso tpico para ayudarle a seguir las instrucciones sobre dnde y cmo usarlos. Las farmacias  generalmente imprimen las instrucciones del medicamento slo en las cajas y no directamente en los tubos del [place].   Si su medicamento es muy caro, por favor, pngase en contacto con Rolm Gala llamando al [phone] y presione la opcin 4 o envenos un mensaje a travs de Clinical cytogeneticist.   No podemos decirle cul ser su copago por los medicamentos por adelantado ya que esto es diferente dependiendo de la cobertura de su seguro. Sin embargo, es posible que podamos encontrar un medicamento sustituto a Audiological scientist un formulario para que el seguro cubra el medicamento que se considera necesario.   Si se requiere una autorizacin previa para que su compaa de seguros [place] su medicamento, por favor permtanos de 1 a 2 das hbiles para completar [address]  Los precios de los medicamentos varan con frecuencia dependiendo del Environmental consultant de dnde se surte la receta y alguna farmacias pueden ofrecer precios ms baratos.  El sitio web www.goodrx.com tiene cupones para medicamentos de Health and safety inspector. Los precios aqu no tienen en cuenta lo que podra costar con la ayuda del seguro (puede  ser ms barato con su seguro), pero el sitio web puede darle el precio si no Field seismologist.  - Puede imprimir el cupn correspondiente y llevarlo con su receta a la farmacia.  - Tambin puede pasar por nuestra oficina durante el horario de atencin regular y Charity fundraiser una tarjeta de cupones de GoodRx.  - Si necesita que su receta se enve electrnicamente a una farmacia diferente, informe a nuestra oficina a travs de MyChart de [place] o por telfono llamando al [phone] y presione la opcin 4.

## 2021-10-14 NOTE — Progress Notes (Signed)
Isotretinoin Follow-Up Visit   Subjective  Shelly Knight is a 21 y.o. female who presents for the following: Acne (Patient here today for 30 day isotretinoin follow up. Patient also taking spironolactone 100 mg once daily. ).  Week # 4 [facility] Birth control pills/condoms   Isotretinoin F/U - 10/14/21 1500       Isotretinoin Follow Up   iPledge # [id number]    Date 10/14/21    Weight 120 lb (54.4 kg)    Two Forms of Birth Control Oral Contraceptives (w/ estrogen);Female Condom    Acne breakouts since last visit? Yes   patient advises break outs occur with her monthly cycle     Dosage   Target Dosage (mg) 8370    Current (To Date) Dosage (mg) 600    To Go Dosage (mg) 7770      Side Effects   Skin Dry Lips    Gastrointestinal WNL    Neurological WNL    Constitutional WNL             Side effects: Dry skin, dry lips  Denies changes in night vision, shortness of breath, abdominal pain, nausea, vomiting, diarrhea, blood in stool or urine, visual changes, headaches, epistaxis, joint pain, myalgias, mood changes, depression, or suicidal ideation.   Patient is not pregnant, not seeking pregnancy, and not breastfeeding.   The following portions of the chart were reviewed this encounter and updated as appropriate: medications, allergies, medical history  Review of Systems:  No other skin or systemic complaints except as noted in HPI or Assessment and Plan.  Objective  Well appearing patient in no apparent distress; mood and affect are within normal limits.  An examination of the face, neck, chest, and back was performed and relevant findings are noted below.   face Face with trace open comedones, rare inflammatory papule Back with few inflammatory papules Chest clear    Assessment & Plan   Acne vulgaris face  Severe and Chronic (present >1 year); currently on Isotretinoin and not to goal (must reach target dose based on weight and also have clear  skin for 2 months prior to discontinuation in order to help prevent relapse)  Urine pregnancy test performed in office today and was negative.  Patient demonstrates comprehension and confirms she will not get pregnant.   Patient confirmed in iPledge and isotretinoin sent to pharmacy.  Continue isotretinoin increasing to Absorica LD 32 mg once daily.  Continue spironolactone 100 mg once daily BP 135/95  Spironolactone can cause increased urination and cause blood pressure to decrease. Please watch for signs of lightheadedness and be cautious when changing position. It can sometimes cause breast tenderness or an irregular period in premenopausal women. It can also increase potassium. The increase in potassium usually is not a concern unless you are taking other medicines that also increase potassium, so please be sure your doctor knows all of the other medications you are taking. This medication should not be taken by pregnant women.  This medicine should also not be taken together with sulfa drugs like Bactrim (trimethoprim/sulfamethexazole).   Patient with good support network with mother and grandmother who keep an eye on her.   Patient has done well on treatment previously with no mood changes.   She was unable to see PCP this month due to an outstanding balance which her mother is working to resolve. She should have seen PCP in f/u by next month's appointment.    ISOtretinoin Micronized (ABSORICA LD) 32 MG  CAPS - face Take 1 capsule by mouth daily.  Related Medications spironolactone (ALDACTONE) 100 MG tablet Take 1 tablet (100 mg total) by mouth daily.    Xerosis secondary to isotretinoin therapy - Continue emollients as directed  Cheilitis secondary to isotretinoin therapy - Continue lip balm as directed, Dr. Clayborne Artist Cortibalm recommended  Long term medication management (isotretinoin) - While taking Isotretinoin and for 30 days after you finish the medication, do not get  pregnant, do not share pills, do not donate blood. Isotretinoin is best absorbed when taken with a fatty meal. Isotretinoin can make you sensitive to the sun. Daily careful sun protection including sunscreen SPF 30+ when outdoors is recommended.  Follow-up in 30 days.  Anise Salvo, RMA, am acting as scribe for Darden Dates, MD .  Documentation: I have reviewed the above documentation for accuracy and completeness, and I agree with the above.  Darden Dates, MD

## 2021-10-21 ENCOUNTER — Encounter: Payer: Self-pay | Admitting: Dermatology

## 2021-11-12 ENCOUNTER — Ambulatory Visit: Payer: No Typology Code available for payment source | Admitting: Dermatology

## 2021-11-17 ENCOUNTER — Other Ambulatory Visit: Payer: Self-pay | Admitting: Dermatology

## 2021-11-17 DIAGNOSIS — L700 Acne vulgaris: Secondary | ICD-10-CM

## 2021-11-25 ENCOUNTER — Ambulatory Visit: Payer: No Typology Code available for payment source | Admitting: Dermatology

## 2021-12-10 ENCOUNTER — Ambulatory Visit: Payer: No Typology Code available for payment source | Admitting: Dermatology

## 2022-01-06 ENCOUNTER — Ambulatory Visit: Payer: No Typology Code available for payment source | Admitting: Dermatology

## 2022-01-20 ENCOUNTER — Ambulatory Visit: Payer: No Typology Code available for payment source | Admitting: Dermatology

## 2022-03-04 ENCOUNTER — Encounter: Payer: Self-pay | Admitting: Dermatology

## 2022-03-04 ENCOUNTER — Ambulatory Visit (INDEPENDENT_AMBULATORY_CARE_PROVIDER_SITE_OTHER): Payer: No Typology Code available for payment source | Admitting: Dermatology

## 2022-03-04 DIAGNOSIS — L700 Acne vulgaris: Secondary | ICD-10-CM

## 2022-03-04 DIAGNOSIS — B079 Viral wart, unspecified: Secondary | ICD-10-CM

## 2022-03-04 NOTE — Patient Instructions (Signed)
Due to recent changes in healthcare laws, you may see results of your pathology and/or laboratory studies on MyChart before the doctors have had a chance to review them. We understand that in some cases there may be results that are confusing or concerning to you. Please understand that not all results are received at the same time and often the doctors may need to interpret multiple results in order to provide you with the best plan of care or course of treatment. Therefore, we ask that you please give us 2 business days to thoroughly review all your results before contacting the office for clarification. Should we see a critical lab result, you will be contacted sooner.   If You Need Anything After Your Visit  If you have any questions or concerns for your doctor, please call our main line at [phone] and press option 4 to reach your doctor's medical assistant. If no one answers, please leave a voicemail as directed and we will return your call as soon as possible. Messages left after 4 pm will be answered the following business day.   You may also send us a message via MyChart. We typically respond to MyChart messages within 1-2 business days.  For prescription refills, please ask your pharmacy to contact our office. Our fax number is [phone].  If you have an urgent issue when the clinic is closed that cannot wait until the next business day, you can page your doctor at the number below.    Please note that while we do our best to be available for urgent issues outside of office hours, we are not available 24/7.   If you have an urgent issue and are unable to reach us, you may choose to seek medical care at your doctor's office, retail clinic, urgent care center, or emergency room.  If you have a medical emergency, please immediately call 911 or go to the emergency department.  Pager Numbers  - Dr. Kowalski: [phone]  - Dr. Moye: [phone]  - Dr. Stewart:  [phone]  In the event of inclement weather, please call our main line at [phone] for an update on the status of any delays or closures.  Dermatology Medication Tips: Please keep the boxes that topical medications come in in order to help keep track of the instructions about where and how to use these. Pharmacies typically print the medication instructions only on the boxes and not directly on the medication tubes.   If your medication is too expensive, please contact our office at [phone] option 4 or send us a message through MyChart.   We are unable to tell what your co-pay for medications will be in advance as this is different depending on your insurance coverage. However, we may be able to find a substitute medication at lower cost or fill out paperwork to get insurance to cover a needed medication.   If a prior authorization is required to get your medication covered by your insurance company, please allow us 1-2 business days to complete this process.  Drug prices often vary depending on where the prescription is filled and some pharmacies may offer cheaper prices.  The website www.goodrx.com contains coupons for medications through different pharmacies. The prices here do not account for what the cost may be with help from insurance (it may be cheaper with your insurance), but the website can give you the price if you did not use any insurance.  - You can print the associated coupon and take it with   your prescription to the pharmacy.  - You may also stop by our office during regular business hours and pick up a GoodRx coupon card.  - If you need your prescription sent electronically to a different pharmacy, notify our office through [facility] MyChart or by phone at [phone] option 4.     Si Usted Necesita Algo Despus de Su Visita  Tambin puede enviarnos un mensaje a travs de MyChart. Por lo general respondemos a los mensajes de MyChart en el transcurso de 1 a 2  das hbiles.  Para renovar recetas, por favor pida a su farmacia que se ponga en contacto con nuestra oficina. Nuestro nmero de fax es el [phone].  Si tiene un asunto urgente cuando la clnica est cerrada y que no puede esperar hasta el siguiente da hbil, puede llamar/localizar a su doctor(a) al nmero que aparece a continuacin.   Por favor, tenga en cuenta que aunque hacemos todo lo posible para estar disponibles para asuntos urgentes fuera del horario de oficina, no estamos disponibles las 24 horas del da, los 7 das de la semana.   Si tiene un problema urgente y no puede comunicarse con nosotros, puede optar por buscar atencin mdica  en el consultorio de su doctor(a), en una clnica privada, en un centro de atencin urgente o en una sala de emergencias.  Si tiene una emergencia mdica, por favor llame inmediatamente al 911 o vaya a la sala de emergencias.  Nmeros de bper  - Dr. Kowalski: [phone]  - Dra. Moye: [phone]  - Dra. Stewart: [phone]  En caso de inclemencias del tiempo, por favor llame a nuestra lnea principal al [phone] para una actualizacin sobre el estado de cualquier retraso o cierre.  Consejos para la medicacin en dermatologa: Por favor, guarde las cajas en las que vienen los medicamentos de uso tpico para ayudarle a seguir las instrucciones sobre dnde y cmo usarlos. Las farmacias generalmente imprimen las instrucciones del medicamento slo en las cajas y no directamente en los tubos del medicamento.   Si su medicamento es muy caro, por favor, pngase en contacto con nuestra oficina llamando al [phone] y presione la opcin 4 o envenos un mensaje a travs de MyChart.   No podemos decirle cul ser su copago por los medicamentos por adelantado ya que esto es diferente dependiendo de la cobertura de su seguro. Sin embargo, es posible que podamos encontrar un medicamento sustituto a menor costo o llenar un formulario para que el  seguro cubra el medicamento que se considera necesario.   Si se requiere una autorizacin previa para que su compaa de seguros cubra su medicamento, por favor permtanos de 1 a 2 das hbiles para completar este proceso.  Los precios de los medicamentos varan con frecuencia dependiendo del lugar de dnde se surte la receta y alguna farmacias pueden ofrecer precios ms baratos.  El sitio web www.goodrx.com tiene cupones para medicamentos de diferentes farmacias. Los precios aqu no tienen en cuenta lo que podra costar con la ayuda del seguro (puede ser ms barato con su seguro), pero el sitio web puede darle el precio si no utiliz ningn seguro.  - Puede imprimir el cupn correspondiente y llevarlo con su receta a la farmacia.  - Tambin puede pasar por nuestra oficina durante el horario de atencin regular y recoger una tarjeta de cupones de GoodRx.  - Si necesita que su receta se enve electrnicamente a una farmacia diferente, informe a nuestra oficina a travs de MyChart de [place]   o por telfono llamando al [phone] y presione la opcin 4.  

## 2022-04-07 ENCOUNTER — Ambulatory Visit (INDEPENDENT_AMBULATORY_CARE_PROVIDER_SITE_OTHER): Payer: No Typology Code available for payment source | Admitting: Dermatology

## 2022-04-07 DIAGNOSIS — B079 Viral wart, unspecified: Secondary | ICD-10-CM | POA: Diagnosis not present

## 2022-04-07 DIAGNOSIS — L700 Acne vulgaris: Secondary | ICD-10-CM | POA: Diagnosis not present

## 2022-04-07 MED ORDER — ABSORICA LD 16 MG PO CAPS: 16.0000 mg | ORAL_CAPSULE | Freq: Every day | ORAL | 0 refills | Status: DC

## 2022-04-07 NOTE — Progress Notes (Signed)
Follow-Up Visit   Subjective  Shelly Knight is a 21 y.o. female who presents for the following: acne vulgaris (Patient here for acne follow up. Here to restart isotretinoin. Patient also reports wart at left 3rd toe. ).  Right 1st toenail  Will evaluate at next appointment patient advised to not wear polish at next visit  The following portions of the chart were reviewed this encounter and updated as appropriate:  Tobacco  Allergies  Meds  Problems  Med Hx  Surg Hx  Fam Hx      Review of Systems: No other skin or systemic complaints except as noted in HPI or Assessment and Plan.   Objective  Well appearing patient in no apparent distress; mood and affect are within normal limits.  A focused examination was performed including face, chest, toe. Relevant physical exam findings are noted in the Assessment and Plan.  face Face with 1+ open comedones, rare inflammatory papule  left 3rd toe x 1 Verrucous papules -- Discussed viral etiology and contagion.    Assessment & Plan  Acne vulgaris face  Acne is severe and chronic (present >1 year) and non-responsive to other therapies; patient is currently on Isotretinoin, requiring FDA mandated monthly evaluations and laboratory monitoring, and not to goal (must reach target dose based on weight and also have clear skin for 2 months prior to discontinuation in order to help prevent relapse)   Restarting interrupted isotretinoin course.  Patient notes mood has been stable. Denies depressed mood, thoughts of self-harm. She will monitor closely for any changes in mood and seek care and stop medication if she has any concerns. She has been on isotretinoin for an extended period in the past and has never had any mood changes associated with isotretinoin use.  Two methods of BC include OBC and female latex condoms. iPLEDGE REMS ID: [id number]   Recommend patient schedule follow up appointment with provider who prescribed Zoloft and  that she continues to be followed by them as well as her therapist.   Urine pregnancy test performed in office today and was negative.  Patient demonstrates comprehension and confirms she will not get pregnant.   HCG Urine Test Dipstick Distributed by [facility] Lot [id number] exp. 2023-06-01  Patient confirmed in iPledge and isotretinoin sent to pharmacy.  Start Isotretinoin (Absorica LD) 16 mg caps - take 1 cap po qd  While taking Isotretinoin and for 30 days after you finish the medication, do not get pregnant, do not share pills, do not donate blood.  Generic isotretinoin is best absorbed when taken with a fatty meal. Isotretinoin can make you sensitive to the sun. Daily careful sun protection including sunscreen SPF 30+ when outdoors is recommended.   ISOtretinoin Micronized (ABSORICA LD) 16 MG CAPS - face Take 16 mg by mouth daily.  Viral warts, unspecified type left 3rd toe x 1  Discussed viral etiology and risk of spread.  Discussed multiple treatments may be required to clear warts.  Discussed possible post-treatment dyspigmentation and risk of recurrence.  Cantharidin Plus is a blistering agent that comes from a beetle.  It needs to be washed off in about 4 hours after application.  Although it is painless when applied in office, it may cause symptoms of mild pain and burning several hours later.  Treated areas will swell and turn red, and blisters may form.  Vaseline and a bandaid may be applied until wound has healed.  Once healed, the skin may remain temporarily discolored.  It can take weeks to months for pigmentation to return to normal.  Advised to wash off with soap and water in 4 hours or sooner if it becomes tender before then.   Destruction of lesion - left 3rd toe x 1  Destruction method: chemical removal   Informed consent: discussed and consent obtained   Timeout:  patient name, date of birth, surgical site, and procedure verified Chemical  destruction method: cantharidin   Chemical destruction method comment:  Plus Application time:  4 hours Procedure instructions: patient instructed to wash and dry area   Outcome: patient tolerated procedure well with no complications   Post-procedure details: wound care instructions given     Return in about 30 days (around 05/07/2022) for accutane follow up. I, Asher Muir, CMA, am acting as scribe for Darden Dates, MD.  Documentation: I have reviewed the above documentation for accuracy and completeness, and I agree with the above.  Darden Dates, MD

## 2022-04-07 NOTE — Patient Instructions (Addendum)
Cantharidin Plus is a blistering agent that comes from a beetle.  It needs to be washed off in about 4 hours after application.  Although it is painless when applied in office, it may cause symptoms of mild pain and burning several hours later.  Treated areas will swell and turn red, and blisters may form.  Vaseline and a bandaid may be applied until wound has healed.  Once healed, the skin may remain temporarily discolored.  It can take weeks to months for pigmentation to return to normal.  Advised to wash off with soap and water in 4 hours or sooner if it becomes tender before then.    While taking Isotretinoin and for 30 days after you finish the medication, do not get pregnant, do not share pills, do not donate blood.  Generic isotretinoin is best absorbed when taken with a fatty meal. Isotretinoin can make you sensitive to the sun. Daily careful sun protection including sunscreen SPF 30+ when outdoors is recommended.    Due to recent changes in healthcare laws, you may see results of your pathology and/or laboratory studies on MyChart before the doctors have had a chance to review them. We understand that in some cases there may be results that are confusing or concerning to you. Please understand that not all results are received at the same time and often the doctors may need to interpret multiple results in order to provide you with the best plan of care or course of treatment. Therefore, we ask that you please give [place] 2 business days to thoroughly review all your results before contacting the office for clarification. Should we see a critical lab result, you will be contacted sooner.   If You Need Anything After Your Visit  If you have any questions or concerns for your doctor, please call our main line at [phone] and press option 4 to reach your doctor's medical assistant. If no one answers, please leave a voicemail as directed and we will return your call as soon as possible. Messages  left after 4 pm will be answered the following business day.   You may also send [place] a message via MyChart. We typically respond to MyChart messages within 1-2 business days.  For prescription refills, please ask your pharmacy to contact our office. Our fax number is [phone].  If you have an urgent issue when the clinic is closed that cannot wait until the next business day, you can page your doctor at the number below.    Please note that while we do our best to be available for urgent issues outside of office hours, we are not available 24/7.   If you have an urgent issue and are unable to reach [place], you may choose to seek medical care at your doctor's office, retail clinic, urgent care center, or emergency room.  If you have a medical emergency, please immediately call 911 or go to the emergency department.  Pager Numbers  - Dr. Gwen Pounds: [phone]  - Dr. Neale Burly: [phone]  - Dr. Roseanne Reno: [phone]  In the event of inclement weather, please call our main line at [phone] for an update on the status of any delays or closures.  Dermatology Medication Tips: Please keep the boxes that topical medications come in in order to help keep track of the instructions about where and how to use these. Pharmacies typically print the medication instructions only on the boxes and not directly on the medication tubes.   If your medication is too  expensive, please contact our office at [phone] option 4 or send [place] a message through MyChart.   We are unable to tell what your co-pay for medications will be in advance as this is different depending on your insurance coverage. However, we may be able to find a substitute medication at lower cost or fill out paperwork to get insurance to cover a needed medication.   If a prior authorization is required to get your medication covered by your insurance company, please allow [place] 1-2 business days to complete this process.  Drug prices  often vary depending on where the prescription is filled and some pharmacies may offer cheaper prices.  The website www.goodrx.com contains coupons for medications through different pharmacies. The prices here do not account for what the cost may be with help from insurance (it may be cheaper with your insurance), but the website can give you the price if you did not use any insurance.  - You can print the associated coupon and take it with your prescription to the pharmacy.  - You may also stop by our office during regular business hours and pick up a GoodRx coupon card.  - If you need your prescription sent electronically to a different pharmacy, notify our office through [facility] or by phone at [phone] option 4.     Si Usted Necesita Algo Despus de Su Visita  Tambin puede enviarnos un mensaje a travs de Clinical cytogeneticist. Por lo general respondemos a los mensajes de MyChart en el transcurso de 1 a 2 das hbiles.  Para renovar recetas, por favor pida a su farmacia que se ponga en contacto con nuestra oficina. Annie Sable de fax es [place] [phone].  Si tiene un asunto urgente cuando la clnica est cerrada y que no puede esperar hasta el siguiente da hbil, puede llamar/localizar a su doctor(a) al nmero que aparece a continuacin.   Por favor, tenga en cuenta que aunque hacemos todo lo posible para estar disponibles para asuntos urgentes fuera del horario de [place], no estamos disponibles las 24 horas del da, los 7 [address]  [address] de la [place].   Si tiene un problema urgente y no puede comunicarse con nosotros, puede optar por buscar atencin mdica  en el consultorio de su doctor(a), en una clnica privada, en un centro de atencin urgente o en una sala de emergencias.  Si tiene Engineer, drilling, por favor llame inmediatamente al 911 o vaya a la sala de emergencias.  Nmeros de bper  - Dr. Gwen Pounds: [phone]  - Dra. Moye: [phone]  - Dra. Roseanne Reno:  [phone]  En caso de inclemencias del [place], por favor llame a Lacy Duverney principal al [phone] para una actualizacin sobre el [place] de cualquier retraso o cierre.  Consejos para la medicacin en dermatologa: Por favor, guarde las cajas en las que vienen los medicamentos de uso tpico para ayudarle a seguir las instrucciones sobre dnde y cmo usarlos. Las farmacias generalmente imprimen las instrucciones del medicamento slo en las cajas y no directamente en los tubos del [place].   Si su medicamento es muy caro, por favor, pngase en contacto con [place] llamando al [phone] y presione la opcin 4 o envenos un mensaje a travs de Clinical cytogeneticist.   No podemos decirle cul ser su copago por los medicamentos por adelantado ya que esto es diferente dependiendo de la cobertura de su seguro. Sin embargo, es posible que podamos encontrar un medicamento sustituto a Audiological scientist un formulario para que el seguro  cubra el medicamento que se considera necesario.   Si se requiere una autorizacin previa para que su compaa de seguros [place] su medicamento, por favor permtanos de 1 a 2 das hbiles para completar [address]  Los precios de los medicamentos varan con frecuencia dependiendo del Environmental consultant de dnde se surte la receta y alguna farmacias pueden ofrecer precios ms baratos.  El sitio web www.goodrx.com tiene cupones para medicamentos de Health and safety inspector. Los precios aqu no tienen en cuenta lo que podra costar con la ayuda del seguro (puede ser ms barato con su seguro), pero el sitio web puede darle el precio si no utiliz Tourist information centre manager.  - Puede imprimir el cupn correspondiente y llevarlo con su receta a la farmacia.  - Tambin puede pasar por nuestra oficina durante el horario de atencin regular y Education officer, museum una tarjeta de cupones de GoodRx.  - Si necesita que su receta se enve electrnicamente a una farmacia diferente, informe a nuestra oficina a travs de  MyChart de [place] o por telfono llamando al [phone] y presione la opcin 4.

## 2022-04-08 ENCOUNTER — Telehealth: Payer: Self-pay

## 2022-04-08 NOTE — Telephone Encounter (Signed)
20 daily take with a fatty meal. thank you!

## 2022-04-08 NOTE — Telephone Encounter (Signed)
Called [place] and made change to prescription. aw

## 2022-04-08 NOTE — Telephone Encounter (Signed)
Patient has [employer] insurance. Absorica is a plan exclusion for this policy. PA can not even be completed as it is kicked out. What strength generic would you like for patient?

## 2022-04-12 ENCOUNTER — Encounter: Payer: Self-pay | Admitting: Dermatology

## 2022-05-12 ENCOUNTER — Ambulatory Visit (INDEPENDENT_AMBULATORY_CARE_PROVIDER_SITE_OTHER): Payer: No Typology Code available for payment source | Admitting: Dermatology

## 2022-05-12 VITALS — Wt 127.0 lb

## 2022-05-12 DIAGNOSIS — Z79899 Other long term (current) drug therapy: Secondary | ICD-10-CM

## 2022-05-12 DIAGNOSIS — L853 Xerosis cutis: Secondary | ICD-10-CM | POA: Diagnosis not present

## 2022-05-12 DIAGNOSIS — L700 Acne vulgaris: Secondary | ICD-10-CM | POA: Diagnosis not present

## 2022-05-12 DIAGNOSIS — K130 Diseases of lips: Secondary | ICD-10-CM | POA: Diagnosis not present

## 2022-05-12 MED ORDER — ABSORICA LD 32 MG PO CAPS: 1.0000 | ORAL_CAPSULE | Freq: Every day | ORAL | 0 refills | Status: AC

## 2022-05-12 NOTE — Progress Notes (Signed)
   Isotretinoin Follow-Up Visit   Subjective  Shelly Knight is a 21 y.o. female who presents for the following: Acne (Patient here today for acne. She is currently taking Absorica LD 16 mg daily. ).  Week # 12 iPledge # [id number] [facility] pills/Female Condoms Target dose - 8640 Total mg/kg - 41.66  This is patient's 3rd course of accutane.    Isotretinoin F/U - 05/12/22 1400       Isotretinoin Follow Up   Weight 127 lb (57.6 kg)    Two Forms of Birth Control Oral Contraceptives (w/ estrogen);Female Condom    Acne breakouts since last visit? Yes      Side Effects   Skin Chapped Lips;Dry Lips    Gastrointestinal WNL    Neurological WNL    Constitutional WNL             Side effects: Dry skin, dry lips  Denies changes in night vision, shortness of breath, abdominal pain, nausea, vomiting, diarrhea, blood in stool or urine, visual changes, headaches, epistaxis, joint pain, myalgias, mood changes, depression, or suicidal ideation.   Patient is not pregnant, not seeking pregnancy, and not breastfeeding.   The following portions of the chart were reviewed this encounter and updated as appropriate: medications, allergies, medical history  Review of Systems:  No other skin or systemic complaints except as noted in HPI or Assessment and Plan.  Objective  Well appearing patient in no apparent distress; mood and affect are within normal limits.  An examination of the face, neck, chest, and back was performed and relevant findings are noted below.   face Trace open comedones     Assessment & Plan   Acne vulgaris face  Acne is severe (significant flares with menses despite spironolactone and other optimized therapy) and chronic (present >1 year); patient is currently on Isotretinoin, requiring FDA mandated monthly evaluations and laboratory monitoring, and not to goal (must reach target dose based on weight and also have clear skin for 2 months prior to  discontinuation in order to help prevent relapse)   Would not recommend repeat course of accutane once this course is completed. Discussed other options to treat hormonal acne.  Continue Absorica LD 32 mg once daily. .....  Urine pregnancy test performed in office today and was negative.  Patient demonstrates comprehension and confirms she will not get pregnant.   Patient confirmed in iPledge and isotretinoin sent to pharmacy.   ISOtretinoin Micronized (ABSORICA LD) 32 MG CAPS - face Take 1 capsule by mouth daily.  Related Medications ISOtretinoin Micronized (ABSORICA LD) 16 MG CAPS Take 16 mg by mouth daily.    Xerosis secondary to isotretinoin therapy - Continue emollients as directed  Cheilitis secondary to isotretinoin therapy - Continue lip balm as directed, Dr. Clayborne Artist Cortibalm recommended  Long term medication management (isotretinoin) - While taking Isotretinoin and for 30 days after you finish the medication, do not get pregnant, do not share pills, do not donate blood. Isotretinoin is best absorbed when taken with a fatty meal. Isotretinoin can make you sensitive to the sun. Daily careful sun protection including sunscreen SPF 30+ when outdoors is recommended.  Follow-up in 30 days.  Anise Salvo, RMA, am acting as scribe for Darden Dates, MD .  Documentation: I have reviewed the above documentation for accuracy and completeness, and I agree with the above.  Darden Dates, MD

## 2022-05-24 ENCOUNTER — Encounter: Payer: Self-pay | Admitting: Dermatology

## 2022-06-16 ENCOUNTER — Ambulatory Visit: Payer: No Typology Code available for payment source | Admitting: Dermatology

## 2022-06-17 ENCOUNTER — Ambulatory Visit: Payer: No Typology Code available for payment source | Admitting: Dermatology

## 2022-06-24 ENCOUNTER — Ambulatory Visit: Payer: No Typology Code available for payment source | Admitting: Dermatology

## 2022-07-08 ENCOUNTER — Ambulatory Visit: Payer: No Typology Code available for payment source | Admitting: Dermatology

## 2022-09-28 ENCOUNTER — Ambulatory Visit: Payer: 59 | Admitting: Dermatology

## 2022-09-29 ENCOUNTER — Telehealth: Payer: Self-pay

## 2022-09-29 NOTE — Telephone Encounter (Signed)
Per Dr. Laurence Ferrari if she no shows or gives less than 48 hours notice without a medical note saying why she couldn't come for next appointment - she is to be dismissed.   Patient verbally advised today over the phone. Scheduled appointment for 10/27/22. aw

## 2022-10-06 DIAGNOSIS — F4310 Post-traumatic stress disorder, unspecified: Secondary | ICD-10-CM | POA: Diagnosis not present

## 2022-10-06 DIAGNOSIS — N920 Excessive and frequent menstruation with regular cycle: Secondary | ICD-10-CM | POA: Diagnosis not present

## 2022-10-06 DIAGNOSIS — F411 Generalized anxiety disorder: Secondary | ICD-10-CM | POA: Diagnosis not present

## 2022-10-06 DIAGNOSIS — F331 Major depressive disorder, recurrent, moderate: Secondary | ICD-10-CM | POA: Diagnosis not present

## 2022-10-27 ENCOUNTER — Telehealth: Payer: Self-pay | Admitting: Dermatology

## 2022-10-27 ENCOUNTER — Encounter: Payer: Self-pay | Admitting: Dermatology

## 2022-10-27 ENCOUNTER — Ambulatory Visit (INDEPENDENT_AMBULATORY_CARE_PROVIDER_SITE_OTHER): Payer: Self-pay | Admitting: Dermatology

## 2022-10-27 VITALS — BP 109/73 | HR 102

## 2022-10-27 DIAGNOSIS — L700 Acne vulgaris: Secondary | ICD-10-CM

## 2022-10-27 DIAGNOSIS — B079 Viral wart, unspecified: Secondary | ICD-10-CM

## 2022-10-27 MED ORDER — DOXYCYCLINE HYCLATE 20 MG PO TABS: 20.0000 mg | ORAL_TABLET | Freq: Two times a day (BID) | ORAL | 3 refills | Status: DC

## 2022-10-27 MED ORDER — CLINDAMYCIN PHOSPHATE 1 % EX SOLN: CUTANEOUS | 11 refills | Status: DC

## 2022-10-27 MED ORDER — TRETINOIN 0.025 % EX CREA: TOPICAL_CREAM | CUTANEOUS | 11 refills | Status: DC

## 2022-10-27 NOTE — Progress Notes (Unsigned)
Follow-Up Visit   Subjective  Shelly Knight is a 22 y.o. female who presents for the following: Acne (Patient here today concerning acne breakouts at face and body. Reports she is not using any treatments. ) and Other (Hx of warts at left 3rd toe, patient would like to have area rechecked and possibly treated. ).  The following portions of the chart were reviewed this encounter and updated as appropriate:      Review of Systems: No other skin or systemic complaints except as noted in HPI or Assessment and Plan.   Objective  Well appearing patient in no apparent distress; mood and affect are within normal limits.  A focused examination was performed including face, left foot. Relevant physical exam findings are noted in the Assessment and Plan.  Head - Anterior (Face) Trace open comedones, rare small inflammatory papule face, chest and back  Left Tip of 3rd Toe Verrucous papules -- Discussed viral etiology and contagion.    Assessment & Plan  Acne vulgaris Head - Anterior (Face)  Chronic and persistent condition with duration or expected duration over one year. Condition is symptomatic/ bothersome to patient. Not currently at goal.  She asks about restarting isotretinoin but I do not recommend it at this time both since it requires consistent monthly visits and because her skin has been doing well since her last partial round with isotretinoin and based on her current acne I believe we can likely get her under control with topicals or other oral treatments.  Start doxycycline 20 mg capsule - take 1 capsule by mouth bid with food and drink.   Start clindamycin 1 % external solution - apply topically qd/bid to aa of face  Start Tretinoin 0.025 % cream - apply pea sized amount to face nightly. Discussed apply every other night the first 2 weeks.   Pair with Cln acne wash, PanOyxl 4% creamy wash or [facility] hypochlorous spray.  If not doing well will consider  spironolactone, aczone, or winlevi depending on insurance coverage   Doxycycline should be taken with food to prevent nausea. Do not lay down for 30 minutes after taking. Be cautious with sun exposure and use good sun protection while on this medication. Pregnant women should not take this medication.   Topical retinoid medications like tretinoin/Retin-A, adapalene/Differin, tazarotene/Fabior, and Epiduo/Epiduo Forte can cause dryness and irritation when first started. Only apply a pea-sized amount to the entire affected area. Avoid applying it around the eyes, edges of mouth and creases at the nose. If you experience irritation, use a good moisturizer first and/or apply the medicine less often. If you are doing well with the medicine, you can increase how often you use it until you are applying every night. Be careful with sun protection while using this medication as it can make you sensitive to the sun. This medicine should not be used by pregnant women.   tretinoin (RETIN-A) 0.025 % cream - Head - Anterior (Face) Apply pea sized amount to face nightly for acne  clindamycin (CLEOCIN T) 1 % external solution - Head - Anterior (Face) Apply topically to aa of face for acne qd/bid  doxycycline (PERIOSTAT) 20 MG tablet - Head - Anterior (Face) Take 1 tablet (20 mg total) by mouth 2 (two) times daily. Take with food and drink. Do not lay down for 30 minutes after taking. Be cautious with sun exposure and use good sun protection while on this medication. Pregnant women should not take this medication.  Viral warts, unspecified  type Left Tip of 3rd Toe  Viral Wart (HPV) Counseling  Discussed viral / HPV (Human Papilloma Virus) etiology and risk of spread /infectivity to other areas of body as well as to other people.  Multiple treatments and methods may be required to clear warts and it is possible treatment may not be successful.  Treatment risks include discoloration; scarring and there is still  potential for wart recurrence.  Defer in office treatment due to being unsure about her insurance coverage.   Discussed skin medicinals 5-FU/salicylic acid treatment. Deferred at this time.   Discussed using over the counter Compound W with salicylic acid apply to area and cover with duct tape   Will recheck at next follow up   Return for 1 - 2 month check acne and wart .  I, Ruthell Rummage, CMA, am acting as scribe for Forest Gleason, MD.  Documentation: I have reviewed the above documentation for accuracy and completeness, and I agree with the above.  Forest Gleason, MD

## 2022-10-27 NOTE — Telephone Encounter (Signed)
Notified patient at visit on 10/27/2022 that if she has any additional no shows or last minute cancellations (less than 48 hours before visit) without a doctor's note, we will have to dismiss her from the practice. She voiced understanding.

## 2022-10-27 NOTE — Patient Instructions (Addendum)
For wart  Viral Wart (HPV) Counseling  Discussed viral / HPV (Human Papilloma Virus) etiology and risk of spread /infectivity to other areas of body as well as to other people.  Multiple treatments and methods may be required to clear warts and it is possible treatment may not be successful.  Treatment risks include discoloration; scarring and there is still potential for wart recurrence.  Over the counter compound w salicylic acid apply to affected areas and cover with duct tape   For Acne    Start doxycycline 20 mg tab take 1 tab by mouth twice daily with food and drink.  Doxycycline should be taken with food to prevent nausea. Do not lay down for 30 minutes after taking. Be cautious with sun exposure and use good sun protection while on this medication. Pregnant women should not take this medication.    Recommend using Cln Acne Wash daily, leave on for 1-2 minutes before rinsing off. This can be purchased  online.  Or   Panoxyl wash (over the counter)   Start clindamycin solution 1 % - apply topically daily to twice daily to face for any acne bumps  Start Tretinoin 0.025 % cream - apply pea sized amount to face nightly for acne Can use every other night if irritated  Recommended non-comedogenic (non-acne causing) facial oils include 100% argan oil or squalane. The can be used after applying any recommended creams or ointments to the skin in the evening. The [employer] Brand has a high-quality and affordable version of both of these and can be found at [place].    Topical retinoid medications like tretinoin/Retin-A, adapalene/Differin, tazarotene/Fabior, and Epiduo/Epiduo Forte can cause dryness and irritation when first started. Only apply a pea-sized amount to the entire affected area. Avoid applying it around the eyes, edges of mouth and creases at the nose. If you experience irritation, use a good moisturizer first and/or apply the medicine less often. If you are doing well  with the medicine, you can increase how often you use it until you are applying every night. Be careful with sun protection while using this medication as it can make you sensitive to the sun. This medicine should not be used by pregnant women.       Due to recent changes in healthcare laws, you may see results of your pathology and/or laboratory studies on MyChart before the doctors have had a chance to review them. We understand that in some cases there may be results that are confusing or concerning to you. Please understand that not all results are received at the same time and often the doctors may need to interpret multiple results in order to provide you with the best plan of care or course of treatment. Therefore, we ask that you please give [place] 2 business days to thoroughly review all your results before contacting the office for clarification. Should we see a critical lab result, you will be contacted sooner.   If You Need Anything After Your Visit  If you have any questions or concerns for your doctor, please call our main line at [phone] and press option 4 to reach your doctor's medical assistant. If no one answers, please leave a voicemail as directed and we will return your call as soon as possible. Messages left after 4 pm will be answered the following business day.   You may also send [place] a message via [place]. We typically respond to MyChart messages within 1-2 business days.  For prescription refills,  please ask your pharmacy to contact our office. Our fax number is [phone].  If you have an urgent issue when the clinic is closed that cannot wait until the next business day, you can page your doctor at the number below.    Please note that while we do our best to be available for urgent issues outside of office hours, we are not available 24/7.   If you have an urgent issue and are unable to reach [place], you may choose to seek medical care at your doctor's office, retail  clinic, urgent care center, or emergency room.  If you have a medical emergency, please immediately call 911 or go to the emergency department.  Pager Numbers  - Dr. Nehemiah Massed: [phone]  - Dr. Laurence Ferrari: [phone]  - Dr. Nicole Kindred: [phone]  In the event of inclement weather, please call our main line at [phone] for an update on the status of any delays or closures.  Dermatology Medication Tips: Please keep the boxes that topical medications come in in order to help keep track of the instructions about where and how to use these. Pharmacies typically print the medication instructions only on the boxes and not directly on the medication tubes.   If your medication is too expensive, please contact our office at [phone] option 4 or send [place] a message through [place].   We are unable to tell what your co-pay for medications will be in advance as this is different depending on your insurance coverage. However, we may be able to find a substitute medication at lower cost or fill out paperwork to get insurance to cover a needed medication.   If a prior authorization is required to get your medication covered by your insurance company, please allow [place] 1-2 business days to complete this process.  Drug prices often vary depending on where the prescription is filled and some pharmacies may offer cheaper prices.  The website www.goodrx.com contains coupons for medications through different pharmacies. The prices here do not account for what the cost may be with help from insurance (it may be cheaper with your insurance), but the website can give you the price if you did not use any insurance.  - You can print the associated coupon and take it with your prescription to the pharmacy.  - You may also stop by our office during regular business hours and pick up a GoodRx coupon card.  - If you need your prescription sent electronically to a different pharmacy, notify our office through [facility] or by phone at [phone] option 4.     Si Usted Necesita Algo Despus de Su Visita  Tambin puede enviarnos un mensaje a travs de Pharmacist, community. Por lo general respondemos a los mensajes de MyChart en el transcurso de 1 a 2 das hbiles.  Para renovar recetas, por favor pida a su farmacia que se ponga en contacto con nuestra oficina. Harland Dingwall de fax es [place] [phone].  Si tiene un asunto urgente cuando la clnica est cerrada y que no puede esperar hasta el siguiente da hbil, puede llamar/localizar a su doctor(a) al nmero que aparece a continuacin.   Por favor, tenga en cuenta que aunque hacemos todo lo posible para estar disponibles para asuntos urgentes fuera del horario de [place], no estamos disponibles las 24 horas del da, los 7 das de la [place].   Si tiene un problema urgente y no puede comunicarse con nosotros, puede optar por buscar atencin Warden/ranger de  su doctor(a), en una clnica privada, en un centro de atencin urgente o en una sala de emergencias.  Si tiene Engineering geologist, por favor llame inmediatamente al 911 o vaya a la sala de emergencias.  Nmeros de bper  - Dr. Nehemiah Massed: [phone]  - Dra. Moye: [phone]  - Dra. Nicole Kindred: [phone]  En caso de inclemencias del [place], por favor llame a Johnsie Kindred principal al [phone] para una actualizacin sobre el [place] de cualquier retraso o cierre.  Consejos para la medicacin en dermatologa: Por favor, guarde las cajas en las que vienen los medicamentos de uso tpico para ayudarle a seguir las instrucciones sobre dnde y cmo usarlos. Las farmacias generalmente imprimen las instrucciones del medicamento slo en las cajas y no directamente en los tubos del [place].   Si su medicamento es muy caro, por favor, pngase en contacto con Zigmund Daniel llamando al [phone] y presione la opcin 4 o envenos un mensaje a travs de Pharmacist, community.   No podemos decirle  cul ser su copago por los medicamentos por adelantado ya que esto es diferente dependiendo de la cobertura de su seguro. Sin embargo, es posible que podamos encontrar un medicamento sustituto a Electrical engineer un formulario para que el seguro cubra el medicamento que se considera necesario.   Si se requiere una autorizacin previa para que su compaa de seguros [place] su medicamento, por favor permtanos de 1 a 2 das hbiles para completar este proceso.  Los precios de los medicamentos varan con frecuencia dependiendo del Environmental consultant de dnde se surte la receta y alguna farmacias pueden ofrecer precios ms baratos.  El sitio web www.goodrx.com tiene cupones para medicamentos de Airline pilot. Los precios aqu no tienen en cuenta lo que podra costar con la ayuda del seguro (puede ser ms barato con su seguro), pero el sitio web puede darle el precio si no utiliz Research scientist (physical sciences).  - Puede imprimir el cupn correspondiente y llevarlo con su receta a la farmacia.  - Tambin puede pasar por nuestra oficina durante el horario de atencin regular y Charity fundraiser una tarjeta de cupones de GoodRx.  - Si necesita que su receta se enve electrnicamente a una farmacia diferente, informe a nuestra oficina a travs de MyChart de [place] o por telfono llamando al [phone] y presione la opcin 4.

## 2022-10-27 NOTE — Telephone Encounter (Signed)
Shelly Knight checked in patient but she was unable to provide insurance information. Patient was to get copies of her insurance card and provide to front staff. Patient left after being seen without giving updated information. Patient will be marked self pay for today's visit. aw

## 2022-11-25 DIAGNOSIS — F4310 Post-traumatic stress disorder, unspecified: Secondary | ICD-10-CM | POA: Diagnosis not present

## 2022-11-25 DIAGNOSIS — F331 Major depressive disorder, recurrent, moderate: Secondary | ICD-10-CM | POA: Diagnosis not present

## 2022-11-25 DIAGNOSIS — F411 Generalized anxiety disorder: Secondary | ICD-10-CM | POA: Diagnosis not present

## 2022-11-25 DIAGNOSIS — R634 Abnormal weight loss: Secondary | ICD-10-CM | POA: Diagnosis not present

## 2022-12-08 ENCOUNTER — Ambulatory Visit: Payer: Commercial Managed Care - PPO | Admitting: Dermatology

## 2022-12-08 ENCOUNTER — Encounter: Payer: Self-pay | Admitting: Dermatology

## 2022-12-08 VITALS — BP 109/73 | Wt 120.0 lb

## 2022-12-08 DIAGNOSIS — Z7189 Other specified counseling: Secondary | ICD-10-CM

## 2022-12-08 DIAGNOSIS — B079 Viral wart, unspecified: Secondary | ICD-10-CM | POA: Diagnosis not present

## 2022-12-08 DIAGNOSIS — L700 Acne vulgaris: Secondary | ICD-10-CM | POA: Diagnosis not present

## 2022-12-08 NOTE — Progress Notes (Signed)
Follow-Up Visit   Subjective  Shelly Knight is a 22 y.o. female who presents for the following: Acne Vulgaris  Patient not using tretinoin because she does not like the way it makes her skin feel and she gets too dry. She is taking doxycycline 20 mg twice daily and using the clindamycin once every few days. Not using any kind of wash or hypochlorous spray because she cannot afford it.  She wants to go back onto isotretinoin to finish her course.    The following portions of the chart were reviewed this encounter and updated as appropriate: medications, allergies, medical history  Review of Systems:  No other skin or systemic complaints except as noted in HPI or Assessment and Plan.  Objective  Well appearing patient in no apparent distress; mood and affect are within normal limits.  Areas Examined: Face, chest and back  Relevant exam findings are noted in the Assessment and Plan.   Assessment & Plan    ACNE VULGARIS Exam: Trace open comedones, rare inflammatory papules at face, back with scattered inflammatory papules  Chronic and persistent condition with duration or expected duration over one year. Condition is symptomatic/ bothersome to patient. Not currently at goal.  Treatment Plan: Continue doxycycline 20 mg twice daily with food.  Patient wants to restart accutane to finish previous course. Discussed importance of being consistent with treatment and finishing course. Will plan to resume where she left off. Will plan to d/c doxycycline at least 5 days before starting isotretinoin again.  She has a history of depression but no history of worsening or SI while on isotretinoin in the past. Will monitor. Drema Halon [id number] [facility] pills/condoms  Urine pregnancy test performed in office today and was negative.  Patient re-registered in iPledge program.  Isotretinoin Counseling; Review and Contraception Counseling: Reviewed potential side effects of  isotretinoin including xerosis, cheilitis, hepatitis, hyperlipidemia, and severe birth defects if taken by a pregnant woman.  Women on isotretinoin must be celibate (not having sex) or required to use at least 2 birth control methods to prevent pregnancy (unless patient is a female of non-child bearing potential).  Females of child-bearing potential must have monthly pregnancy tests while on isotretinoin and report through I-Pledge (FDA monitoring program). Reviewed reports of suicidal ideation in those with a history of depression while taking isotretinoin and reports of diagnosis of inflammatory bowl disease (IBD) while taking isotretinoin as well as the lack of evidence for a causal relationship between isotretinoin, depression and IBD. Patient advised to reach out with any questions or concerns. Patient advised not to share pills or donate blood while on treatment or for one month after completing treatment. All patient's considering Isotretinoin must read and understand and sign Isotretinoin Consent Form and be registered with I-Pledge.   WART Exam: verrucous papule(s)  Discussed viral / HPV (Human Papilloma Virus) etiology and risk of spread /infectivity to other areas of body as well as to other people.  Multiple treatments and methods may be required to clear warts and it is possible treatment may not be successful.  Treatment risks include discoloration; scarring and there is still potential for wart recurrence.  Treatment Plan: Destruction Procedure Note Destruction method: cryotherapy   Informed consent: discussed and consent obtained   Lesion destroyed using liquid nitrogen: Yes   Outcome: patient tolerated procedure well with no complications   Post-procedure details: wound care instructions given   Locations: L tip of 3rd toe # of Lesions Treated: 1  Prior to  procedure, discussed risks of blister formation, small wound, skin dyspigmentation, or rare scar following cryotherapy.  Recommend Vaseline ointment to treated areas while healing.  Destruction Procedure Note Destruction method: chemical removal Informed consent: discussed and consent obtained   Chemical destruction method: Cantharone Plus Outcome: patient tolerated procedure well with no complications   Post-procedure details: Advised to wash off with soap and water in 4 hours or sooner if it becomes tender before then. Locations: L tip of 3rd toe # of Lesions Treated: 1  Prior to procedure discussed that Cantharidin Plus is a blistering agent that comes from a beetle.  It needs to be washed off in about 4 hours after application.  Although it is painless when applied in office, it may cause symptoms of mild pain and burning several hours later.  Treated areas will swell and turn red, and blisters may form.  Vaseline and a bandaid may be applied until wound has healed.  Once healed, the skin may remain temporarily discolored.  It can take weeks to months for pigmentation to return to normal.    Squaric Acid 3% applied to warts today. Prior to application reviewed risk of inflammation and irritation.    Return in about 30 days (around 01/07/2023) for Isotretinoin.  Graciella Belton, RMA, am acting as scribe for Forest Gleason, MD .   Documentation: I have reviewed the above documentation for accuracy and completeness, and I agree with the above.  Forest Gleason, MD

## 2022-12-08 NOTE — Patient Instructions (Signed)
Cryotherapy Aftercare  Wash gently with soap and water everyday.   Apply Vaseline and Band-Aid daily until healed.    Cantharidin Plus is a blistering agent that comes from a beetle.  It needs to be washed off in about 4 hours after application.  Although it is painless when applied in office, it may cause symptoms of mild pain and burning several hours later.  Treated areas will swell and turn red, and blisters may form.  Vaseline and a bandaid may be applied until wound has healed.  Once healed, the skin may remain temporarily discolored.  It can take weeks to months for pigmentation to return to normal.  Advised to wash off with soap and water in 4 hours or sooner if it becomes tender before then.   Due to recent changes in healthcare laws, you may see results of your pathology and/or laboratory studies on MyChart before the doctors have had a chance to review them. We understand that in some cases there may be results that are confusing or concerning to you. Please understand that not all results are received at the same time and often the doctors may need to interpret multiple results in order to provide you with the best plan of care or course of treatment. Therefore, we ask that you please give us 2 business days to thoroughly review all your results before contacting the office for clarification. Should we see a critical lab result, you will be contacted sooner.   If You Need Anything After Your Visit  If you have any questions or concerns for your doctor, please call our main line at [phone] and press option 4 to reach your doctor's medical assistant. If no one answers, please leave a voicemail as directed and we will return your call as soon as possible. Messages left after 4 pm will be answered the following business day.   You may also send us a message via MyChart. We typically respond to MyChart messages within 1-2 business days.  For prescription refills, please ask your  pharmacy to contact our office. Our fax number is [phone].  If you have an urgent issue when the clinic is closed that cannot wait until the next business day, you can page your doctor at the number below.    Please note that while we do our best to be available for urgent issues outside of office hours, we are not available 24/7.   If you have an urgent issue and are unable to reach us, you may choose to seek medical care at your doctor's office, retail clinic, urgent care center, or emergency room.  If you have a medical emergency, please immediately call 911 or go to the emergency department.  Pager Numbers  - Dr. Kowalski: [phone]  - Dr. Moye: [phone]  - Dr. Stewart: [phone]  In the event of inclement weather, please call our main line at [phone] for an update on the status of any delays or closures.  Dermatology Medication Tips: Please keep the boxes that topical medications come in in order to help keep track of the instructions about where and how to use these. Pharmacies typically print the medication instructions only on the boxes and not directly on the medication tubes.   If your medication is too expensive, please contact our office at [phone] option 4 or send us a message through MyChart.   We are unable to tell what your co-pay for medications will be in advance as this is different depending on   your insurance coverage. However, we may be able to find a substitute medication at lower cost or fill out paperwork to get insurance to cover a needed medication.   If a prior authorization is required to get your medication covered by your insurance company, please allow us 1-2 business days to complete this process.  Drug prices often vary depending on where the prescription is filled and some pharmacies may offer cheaper prices.  The website www.goodrx.com contains coupons for medications through different pharmacies. The prices here do not  account for what the cost may be with help from insurance (it may be cheaper with your insurance), but the website can give you the price if you did not use any insurance.  - You can print the associated coupon and take it with your prescription to the pharmacy.  - You may also stop by our office during regular business hours and pick up a GoodRx coupon card.  - If you need your prescription sent electronically to a different pharmacy, notify our office through [facility] MyChart or by phone at [phone] option 4.     Si Usted Necesita Algo Despus de Su Visita  Tambin puede enviarnos un mensaje a travs de MyChart. Por lo general respondemos a los mensajes de MyChart en el transcurso de 1 a 2 das hbiles.  Para renovar recetas, por favor pida a su farmacia que se ponga en contacto con nuestra oficina. Nuestro nmero de fax es el [phone].  Si tiene un asunto urgente cuando la clnica est cerrada y que no puede esperar hasta el siguiente da hbil, puede llamar/localizar a su doctor(a) al nmero que aparece a continuacin.   Por favor, tenga en cuenta que aunque hacemos todo lo posible para estar disponibles para asuntos urgentes fuera del horario de oficina, no estamos disponibles las 24 horas del da, los 7 das de la semana.   Si tiene un problema urgente y no puede comunicarse con nosotros, puede optar por buscar atencin mdica  en el consultorio de su doctor(a), en una clnica privada, en un centro de atencin urgente o en una sala de emergencias.  Si tiene una emergencia mdica, por favor llame inmediatamente al 911 o vaya a la sala de emergencias.  Nmeros de bper  - Dr. Kowalski: [phone]  - Dra. Moye: [phone]  - Dra. Stewart: [phone]  En caso de inclemencias del tiempo, por favor llame a nuestra lnea principal al [phone] para una actualizacin sobre el estado de cualquier retraso o cierre.  Consejos para la medicacin en dermatologa: Por  favor, guarde las cajas en las que vienen los medicamentos de uso tpico para ayudarle a seguir las instrucciones sobre dnde y cmo usarlos. Las farmacias generalmente imprimen las instrucciones del medicamento slo en las cajas y no directamente en los tubos del medicamento.   Si su medicamento es muy caro, por favor, pngase en contacto con nuestra oficina llamando al [phone] y presione la opcin 4 o envenos un mensaje a travs de MyChart.   No podemos decirle cul ser su copago por los medicamentos por adelantado ya que esto es diferente dependiendo de la cobertura de su seguro. Sin embargo, es posible que podamos encontrar un medicamento sustituto a menor costo o llenar un formulario para que el seguro cubra el medicamento que se considera necesario.   Si se requiere una autorizacin previa para que su compaa de seguros cubra su medicamento, por favor permtanos de 1 a 2 das hbiles para completar este proceso.    Los precios de los medicamentos varan con frecuencia dependiendo del lugar de dnde se surte la receta y alguna farmacias pueden ofrecer precios ms baratos.  El sitio web www.goodrx.com tiene cupones para medicamentos de diferentes farmacias. Los precios aqu no tienen en cuenta lo que podra costar con la ayuda del seguro (puede ser ms barato con su seguro), pero el sitio web puede darle el precio si no utiliz ningn seguro.  - Puede imprimir el cupn correspondiente y llevarlo con su receta a la farmacia.  - Tambin puede pasar por nuestra oficina durante el horario de atencin regular y recoger una tarjeta de cupones de GoodRx.  - Si necesita que su receta se enve electrnicamente a una farmacia diferente, informe a nuestra oficina a travs de MyChart de [place] o por telfono llamando al [phone] y presione la opcin 4.  

## 2023-01-12 ENCOUNTER — Ambulatory Visit: Payer: Commercial Managed Care - PPO | Admitting: Dermatology

## 2023-01-12 ENCOUNTER — Encounter: Payer: Self-pay | Admitting: Dermatology

## 2023-01-12 DIAGNOSIS — Z79899 Other long term (current) drug therapy: Secondary | ICD-10-CM

## 2023-01-12 DIAGNOSIS — L700 Acne vulgaris: Secondary | ICD-10-CM | POA: Diagnosis not present

## 2023-01-12 NOTE — Patient Instructions (Signed)
Recommend taking Heliocare sun protection supplement daily in sunny weather for additional sun protection. For maximum protection on the sunniest days, you can take up to 2 capsules of regular Heliocare OR take 1 capsule of Heliocare Ultra. For prolonged exposure (such as a full day in the sun), you can repeat your dose of the supplement 4 hours after your first dose. Heliocare can be purchased at [facility], at some [facility] or at www.heliocare.com.    Due to recent changes in healthcare laws, you may see results of your pathology and/or laboratory studies on MyChart before the doctors have had a chance to review them. We understand that in some cases there may be results that are confusing or concerning to you. Please understand that not all results are received at the same time and often the doctors may need to interpret multiple results in order to provide you with the best plan of care or course of treatment. Therefore, we ask that you please give us 2 business days to thoroughly review all your results before contacting the office for clarification. Should we see a critical lab result, you will be contacted sooner.   If You Need Anything After Your Visit  If you have any questions or concerns for your doctor, please call our main line at [phone] and press option 4 to reach your doctor's medical assistant. If no one answers, please leave a voicemail as directed and we will return your call as soon as possible. Messages left after 4 pm will be answered the following business day.   You may also send us a message via MyChart. We typically respond to MyChart messages within 1-2 business days.  For prescription refills, please ask your pharmacy to contact our office. Our fax number is [phone].  If you have an urgent issue when the clinic is closed that cannot wait until the next business day, you can page your doctor at the number below.    Please note that while we do our best  to be available for urgent issues outside of office hours, we are not available 24/7.   If you have an urgent issue and are unable to reach us, you may choose to seek medical care at your doctor's office, retail clinic, urgent care center, or emergency room.  If you have a medical emergency, please immediately call 911 or go to the emergency department.  Pager Numbers  - Dr. Kowalski: [phone]  - Dr. Moye: [phone]  - Dr. Stewart: [phone]  In the event of inclement weather, please call our main line at [phone] for an update on the status of any delays or closures.  Dermatology Medication Tips: Please keep the boxes that topical medications come in in order to help keep track of the instructions about where and how to use these. Pharmacies typically print the medication instructions only on the boxes and not directly on the medication tubes.   If your medication is too expensive, please contact our office at [phone] option 4 or send us a message through MyChart.   We are unable to tell what your co-pay for medications will be in advance as this is different depending on your insurance coverage. However, we may be able to find a substitute medication at lower cost or fill out paperwork to get insurance to cover a needed medication.   If a prior authorization is required to get your medication covered by your insurance company, please allow us 1-2 business days to complete this process.    Drug prices often vary depending on where the prescription is filled and some pharmacies may offer cheaper prices.  The website www.goodrx.com contains coupons for medications through different pharmacies. The prices here do not account for what the cost may be with help from insurance (it may be cheaper with your insurance), but the website can give you the price if you did not use any insurance.  - You can print the associated coupon and take it with your prescription to the  pharmacy.  - You may also stop by our office during regular business hours and pick up a GoodRx coupon card.  - If you need your prescription sent electronically to a different pharmacy, notify our office through [facility] or by phone at [phone] option 4.

## 2023-01-12 NOTE — Progress Notes (Signed)
   Follow-Up Visit   Subjective  Shelly Knight is a 22 y.o. female who presents for the following: Acne Vulgaris  Patient here today to restart isotretinoin. She has stopped taking the doxycycline well over a week ago - she advises she did not like it and it didn't do anything. She is using the clindamycin. Patient has used spironolactone in the past but prefers to restart isotretinoin. Patient was registered at last appt.  The following portions of the chart were reviewed this encounter and updated as appropriate: medications, allergies, medical history  Review of Systems:  No other skin or systemic complaints except as noted in HPI or Assessment and Plan.  Objective  Well appearing patient in no apparent distress; mood and affect are within normal limits.  Areas Examined: Face, chest and back  Relevant exam findings are noted in the Assessment and Plan.   Assessment & Plan    ACNE VULGARIS Exam: 1 + open comedones, rare tiny inflammatory papule at face, chest and back  Chronic and persistent condition with duration or expected duration over one year. Condition is bothersome/symptomatic for patient. Currently flared.   Treatment Plan: Pending lab results, start Absorica 30 mg 1 po qd  Desma Mcgregor #[id number] [facility] pills/condoms  Patient completed one course of isotretinoin before 2021, but acne was recurrent. She was restarted in 2021 but did not complete the course due to insurance issues. She was restarted in late 2022 and again in 2023 after failing oral doxycycline and spironolactone and topicals, but again she was unable to complete the course. As of 05/12/22, patient was at Week #12 and total mg/kg was 41.66 mg/kg  While taking Isotretinoin and for 30 days after you finish the medication, do not get pregnant, do not share pills, do not donate blood.  Generic isotretinoin is best absorbed when taken with a fatty meal. Isotretinoin can make you sensitive to  the sun. Daily careful sun protection including sunscreen SPF 30+ when outdoors is recommended.  Do not take doxycycline while on isotretinoin and for 30 days after your last dose.  We did discuss that women with hormonal acne often will ultimately require acne treatment long-term after finishing isotretinoin to stay clear, though acne is often easier to control after the isotretinoin course.    Return in about 30 days (around 02/11/2023) for Isotretinoin.  Anise Salvo, RMA, am acting as scribe for Darden Dates, MD .   Documentation: I have reviewed the above documentation for accuracy and completeness, and I agree with the above.  Darden Dates, MD

## 2023-01-14 DIAGNOSIS — B3731 Acute candidiasis of vulva and vagina: Secondary | ICD-10-CM | POA: Diagnosis not present

## 2023-01-14 DIAGNOSIS — R300 Dysuria: Secondary | ICD-10-CM | POA: Diagnosis not present

## 2023-01-14 DIAGNOSIS — Z202 Contact with and (suspected) exposure to infections with a predominantly sexual mode of transmission: Secondary | ICD-10-CM | POA: Diagnosis not present

## 2023-01-14 DIAGNOSIS — A499 Bacterial infection, unspecified: Secondary | ICD-10-CM | POA: Diagnosis not present

## 2023-01-14 DIAGNOSIS — N390 Urinary tract infection, site not specified: Secondary | ICD-10-CM | POA: Diagnosis not present

## 2023-01-18 DIAGNOSIS — L700 Acne vulgaris: Secondary | ICD-10-CM | POA: Diagnosis not present

## 2023-01-19 ENCOUNTER — Telehealth: Payer: Self-pay

## 2023-01-19 ENCOUNTER — Other Ambulatory Visit: Payer: Self-pay

## 2023-01-19 LAB — HEPATIC FUNCTION PANEL
ALT: 15 IU/L (ref 0–32)
AST: 23 IU/L (ref 0–40)
Albumin: 4.4 g/dL (ref 4.0–5.0)
Alkaline Phosphatase: 63 IU/L (ref 44–121)
Bilirubin Total: 0.2 mg/dL (ref 0.0–1.2)
Bilirubin, Direct: <0.1 mg/dL (ref 0.00–0.40)
Total Protein: 7.2 g/dL (ref 6.0–8.5)

## 2023-01-19 LAB — HCG, SERUM, QUALITATIVE: hCG,Beta Subunit,Qual,Serum: NEGATIVE m[IU]/mL (ref <6)

## 2023-01-19 LAB — LIPID PANEL
Chol/HDL Ratio: 2.6 ratio (ref 0.0–4.4)
Cholesterol, Total: 143 mg/dL (ref 100–199)
HDL: 55 mg/dL (ref >39)
LDL Chol Calc (NIH): 75 mg/dL (ref 0–99)
Triglycerides: 62 mg/dL (ref 0–149)
VLDL Cholesterol Cal: 13 mg/dL (ref 5–40)

## 2023-01-19 MED ORDER — ISOTRETINOIN 30 MG PO CAPS: 30.0000 mg | ORAL_CAPSULE | Freq: Every day | ORAL | 0 refills | Status: DC

## 2023-01-19 NOTE — Telephone Encounter (Signed)
-----   Message from Sandi Mealy, MD sent at 01/19/2023 10:37 AM EDT ----- Labs ok, cont isotretinoin  MAs please call. Thank you!

## 2023-01-19 NOTE — Telephone Encounter (Signed)
Patient advised labs OK and to continue accutane. aw

## 2023-02-21 ENCOUNTER — Ambulatory Visit: Payer: Commercial Managed Care - PPO | Admitting: Dermatology

## 2023-02-21 ENCOUNTER — Telehealth: Payer: Self-pay

## 2023-02-21 NOTE — Telephone Encounter (Signed)
Patient called today to reschedule 4:30 appointment. Per Dr. Roseanne [place] she will allow patient to reschedule ONCE and then dismissal without the 48 hour notice. Patient advised and agrees.  Rescheduled for July 3rd. aw

## 2023-02-21 NOTE — Telephone Encounter (Signed)
Patient called to cancel her appt today

## 2023-03-16 ENCOUNTER — Ambulatory Visit (INDEPENDENT_AMBULATORY_CARE_PROVIDER_SITE_OTHER): Payer: Commercial Managed Care - PPO | Admitting: Dermatology

## 2023-03-16 VITALS — Wt 120.0 lb

## 2023-03-16 DIAGNOSIS — L853 Xerosis cutis: Secondary | ICD-10-CM

## 2023-03-16 DIAGNOSIS — K130 Diseases of lips: Secondary | ICD-10-CM

## 2023-03-16 DIAGNOSIS — Z79899 Other long term (current) drug therapy: Secondary | ICD-10-CM

## 2023-03-16 DIAGNOSIS — L700 Acne vulgaris: Secondary | ICD-10-CM

## 2023-03-16 MED ORDER — ISOTRETINOIN 40 MG PO CAPS
40.0000 mg | ORAL_CAPSULE | Freq: Two times a day (BID) | ORAL | 0 refills | Status: DC
Start: 2023-03-16 — End: 2023-05-03

## 2023-03-16 NOTE — Patient Instructions (Signed)
While taking Isotretinoin and for 30 days after you finish the medication, do not get pregnant, do not share pills, do not donate blood.  Generic isotretinoin is best absorbed when taken with a fatty meal. Isotretinoin can make you sensitive to the sun. Daily careful sun protection including sunscreen SPF 30+ when outdoors is recommended.  Due to recent changes in healthcare laws, you may see results of your pathology and/or laboratory studies on MyChart before the doctors have had a chance to review them. We understand that in some cases there may be results that are confusing or concerning to you. Please understand that not all results are received at the same time and often the doctors may need to interpret multiple results in order to provide you with the best plan of care or course of treatment. Therefore, we ask that you please give [place] 2 business days to thoroughly review all your results before contacting the office for clarification. Should we see a critical lab result, you will be contacted sooner.   If You Need Anything After Your Visit  If you have any questions or concerns for your doctor, please call our main line at [phone] and press option 4 to reach your doctor's medical assistant. If no one answers, please leave a voicemail as directed and we will return your call as soon as possible. Messages left after 4 pm will be answered the following business day.   You may also send [place] a message via MyChart. We typically respond to MyChart messages within 1-2 business days.  For prescription refills, please ask your pharmacy to contact our office. Our fax number is [phone].  If you have an urgent issue when the clinic is closed that cannot wait until the next business day, you can page your doctor at the number below.    Please note that while we do our best to be available for urgent issues outside of office hours, we are not available 24/7.   If you have an urgent issue and are  unable to reach [place], you may choose to seek medical care at your doctor's office, retail clinic, urgent care center, or emergency room.  If you have a medical emergency, please immediately call 911 or go to the emergency department.  Pager Numbers  - Dr. Gwen Pounds: [phone]  - Dr. Neale Burly: [phone]  - Dr. Roseanne [place]: [phone]  In the event of inclement weather, please call our main line at [phone] for an update on the status of any delays or closures.  Dermatology Medication Tips: Please keep the boxes that topical medications come in in order to help keep track of the instructions about where and how to use these. Pharmacies typically print the medication instructions only on the boxes and not directly on the medication tubes.   If your medication is too expensive, please contact our office at [phone] option 4 or send [place] a message through MyChart.   We are unable to tell what your co-pay for medications will be in advance as this is different depending on your insurance coverage. However, we may be able to find a substitute medication at lower cost or fill out paperwork to get insurance to cover a needed medication.   If a prior authorization is required to get your medication covered by your insurance company, please allow [place] 1-2 business days to complete this process.  Drug prices often vary depending on where the prescription is filled and some pharmacies may offer cheaper prices.  The website www.goodrx.com  contains coupons for medications through different pharmacies. The prices here do not account for what the cost may be with help from insurance (it may be cheaper with your insurance), but the website can give you the price if you did not use any insurance.  - You can print the associated coupon and take it with your prescription to the pharmacy.  - You may also stop by our office during regular business hours and pick up a GoodRx coupon card.  - If you need your  prescription sent electronically to a different pharmacy, notify our office through [facility] or by phone at [phone] option 4.

## 2023-03-16 NOTE — Progress Notes (Signed)
Isotretinoin Follow-Up Visit   Subjective  Shelly Knight is a 22 y.o. female who presents for the following: Isotretinoin follow-up  Week # 4 Pharmacy [facility] iPLEDGE # [id number] Total mg -  900 Total mg/kg - 16.5 Birth Control- oral contraceptives, female condom     Isotretinoin F/U - 03/16/23 1500       Isotretinoin Follow Up   iPledge # [id number]    Date 03/16/23    Weight 120 lb (54.4 kg)    Two Forms of Birth Control Oral Contraceptives (w/ estrogen);Female Condom    Acne breakouts since last visit? Yes      Dosage   Target Dosage (mg) 8160    Current (To Date) Dosage (mg) 900    To Go Dosage (mg) 7260              Side effects: Dry skin, dry lips  Patient is not pregnant, not seeking pregnancy, and not breastfeeding.   The following portions of the chart were reviewed this encounter and updated as appropriate: medications, allergies, medical history  Review of Systems:  No other skin or systemic complaints except as noted in HPI or Assessment and Plan.  Objective  Well appearing patient in no apparent distress; mood and affect are within normal limits.  An examination of the face, neck, chest, and back was performed and relevant findings are noted below.     Assessment & Plan   Acne vulgaris  Related Medications tretinoin (RETIN-A) 0.025 % cream Apply pea sized amount to face nightly for acne  clindamycin (CLEOCIN T) 1 % external solution Apply topically to aa of face for acne qd/bid  doxycycline (PERIOSTAT) 20 MG tablet Take 1 tablet (20 mg total) by mouth 2 (two) times daily. Take with food and drink. Do not lay down for 30 minutes after taking. Be cautious with sun exposure and use good sun protection while on this medication. Pregnant women should not take this medication.  ISOtretinoin (ABSORICA) 40 MG capsule Take 1 capsule (40 mg total) by mouth 2 (two) times daily.    ACNE VULGARIS Patient is currently on  Isotretinoin requiring FDA mandated monthly evaluations and laboratory monitoring. Condition is currently not to goal (must reach target dose based on weight and also have clear skin for 2 months prior to discontinuation in order to help prevent relapse)  Exam findings: Resolving inflammatory papules left perioral, closed comedones at chin and forehead  Week # 4 Pharmacy [facility] iPLEDGE # [id number] Total mg -  900 Total mg/kg - 16.5 Birth Control- oral contraceptives, female condom  Treatment Plan:  Continue isotretinoin, increasing to 40 mg once daily.   Urine pregnancy test performed in office today and was negative.  Patient demonstrates comprehension and confirms she will not get pregnant.   Patient confirmed in iPledge and isotretinoin sent to pharmacy.    Xerosis secondary to isotretinoin therapy - Continue emollients as directed - Xyzal (levocetirizine) once a day and fish oil 1 gram daily may also help with dryness   Cheilitis secondary to isotretinoin therapy - Continue lip balm as directed, Dr. Clayborne Artist Cortibalm recommended   Long term medication management (isotretinoin) - While taking Isotretinoin and for 30 days after you finish the medication, do not get pregnant, do not share pills, do not donate blood. Isotretinoin is best absorbed when taken with a fatty meal. Isotretinoin can make you sensitive to the sun. Daily careful sun protection including sunscreen SPF 30+ when outdoors is recommended.  Follow-up  in 30 days.  Anise Salvo, RMA, am acting as scribe for Willeen Niece, MD .   Documentation: I have reviewed the above documentation for accuracy and completeness, and I agree with the above.  Willeen Niece, MD

## 2023-03-25 DIAGNOSIS — L739 Follicular disorder, unspecified: Secondary | ICD-10-CM | POA: Diagnosis not present

## 2023-04-13 ENCOUNTER — Ambulatory Visit: Payer: Commercial Managed Care - PPO | Admitting: Dermatology

## 2023-05-03 ENCOUNTER — Encounter: Payer: Self-pay | Admitting: Dermatology

## 2023-05-03 ENCOUNTER — Ambulatory Visit (INDEPENDENT_AMBULATORY_CARE_PROVIDER_SITE_OTHER): Payer: Commercial Managed Care - PPO | Admitting: Dermatology

## 2023-05-03 VITALS — BP 92/68 | HR 123 | Wt 120.0 lb

## 2023-05-03 DIAGNOSIS — L700 Acne vulgaris: Secondary | ICD-10-CM

## 2023-05-03 DIAGNOSIS — L853 Xerosis cutis: Secondary | ICD-10-CM | POA: Diagnosis not present

## 2023-05-03 DIAGNOSIS — Z3201 Encounter for pregnancy test, result positive: Secondary | ICD-10-CM | POA: Diagnosis not present

## 2023-05-03 DIAGNOSIS — K130 Diseases of lips: Secondary | ICD-10-CM | POA: Diagnosis not present

## 2023-05-03 NOTE — Progress Notes (Signed)
   Isotretinoin Follow-Up Visit   Subjective  Shelly Knight is a 22 y.o. female who presents for the following: Isotretinoin follow-up. Patient performed urine pregnancy test in office today, resulting in a positive result (tests x 2 performed). Patient states she had protected sex, but condom broke. She started having cramping and pain on Friday and had a positive home pregnancy test, 4 days ago, and the following day she passed what appeared to be reproductive tissue (photos below). She is still having some bleeding, but no pain. She has discontinued isotretinoin. No SI or HI.  Week # 8   Isotretinoin F/U - 05/03/23 1600       Isotretinoin Follow Up   iPledge # [id number]    Date 05/03/23    Weight 120 lb (54.4 kg)    Two Forms of Birth Control Oral Contraceptives (w/ estrogen);Female Condom    Acne breakouts since last visit? Yes      Dosage   Target Dosage (mg) 8160    Current (To Date) Dosage (mg) 2100    To Go Dosage (mg) 6060      Side Effects   Skin Chapped Lips;Dry Lips    Gastrointestinal WNL    Neurological WNL   Patient states her depression is under control.   Constitutional WNL              Side effects: Dry skin, dry lips  The following portions of the chart were reviewed this encounter and updated as appropriate: medications, allergies, medical history  Review of Systems:  No other skin or systemic complaints except as noted in HPI or Assessment and Plan.  Objective  Well appearing patient in no apparent distress; mood and affect are within normal limits.  An examination of the face was performed and relevant findings are noted below.        Assessment & Plan   Positive pregnancy test  Related Procedures Ambulatory referral to Obstetrics / Gynecology CBC with Differential/Platelets HCG, Qualitative CMP    ACNE VULGARIS, chronic, not at goal  Exam: No acne lesions on face  Week # 8 iPLEDGE # [id number]  Total mg -  2100 mg Total  mg/kg - 38.6 mg/kg Birth Control- oral contraceptives, female condom  URINE PREGNANCY TEST X 2 POSITIVE Pregnancy reported in iPLEDGE program under Dr Gwen Pounds today (cannot find patient in my iPledge). STOP ISOTRETINOIN Instructed patient to not take any more isotretinoin even if tablets remain at home Labs sent: CBC with diff, mainly to check hemoglobin and RBCs in setting of continued bleeding, CMP, serum HCG to corroborate urine test Sent referral to OB/Gyn for management of apparent miscarriage, continued bleeding, counseling on reproductive health  Xerosis secondary to isotretinoin therapy - Continue emollients as directed - Xyzal (levocetirizine) once a day and fish oil 1 gram daily may also help with dryness  Cheilitis secondary to isotretinoin therapy - Continue lip balm as directed, Dr. Clayborne Artist Cortibalm recommended  Follow-up in 30 days.  ICherlyn Labella, CMA, am acting as scribe for Elie Goody, MD . Dr. Armida Sans was present for the visit and helped with counseling and treatment plan  Documentation: I have reviewed the above documentation for accuracy and completeness, and I agree with the above.  Elie Goody, MD

## 2023-05-03 NOTE — Patient Instructions (Signed)
 Isotretinoin Counseling; Review and Contraception Counseling: Reviewed potential side effects of isotretinoin including xerosis, cheilitis, hepatitis, hyperlipidemia, and severe birth defects if taken by a pregnant woman.  Women on isotretinoin must be celibate (not having sex) or required to use at least 2 birth control methods to prevent pregnancy (unless patient is a female of non-child bearing potential).  Females of child-bearing potential must have monthly pregnancy tests while on isotretinoin and report through I-Pledge (FDA monitoring program). Reviewed reports of suicidal ideation in those with a history of depression while taking isotretinoin and reports of diagnosis of inflammatory bowl disease (IBD) while taking isotretinoin as well as the lack of evidence for a causal relationship between isotretinoin, depression and IBD. Patient advised to reach out with any questions or concerns. Patient advised not to share pills or donate blood while on treatment or for one month after completing treatment. All patient's considering Isotretinoin must read and understand and sign Isotretinoin Consent Form and be registered with I-Pledge.   Due to recent changes in healthcare laws, you may see results of your pathology and/or laboratory studies on MyChart before the doctors have had a chance to review them. We understand that in some cases there may be results that are confusing or concerning to you. Please understand that not all results are received at the same time and often the doctors may need to interpret multiple results in order to provide you with the best plan of care or course of treatment. Therefore, we ask that you please give [place] 2 business days to thoroughly review all your results before contacting the office for clarification. Should we see a critical lab result, you will be contacted sooner.   If You Need Anything After Your Visit  If you have any questions or concerns for your doctor,  please call our main line at [phone] and press option 4 to reach your doctor's medical assistant. If no one answers, please leave a voicemail as directed and we will return your call as soon as possible. Messages left after 4 pm will be answered the following business day.   You may also send [place] a message via MyChart. We typically respond to MyChart messages within 1-2 business days.  For prescription refills, please ask your pharmacy to contact our office. Our fax number is [phone].  If you have an urgent issue when the clinic is closed that cannot wait until the next business day, you can page your doctor at the number below.    Please note that while we do our best to be available for urgent issues outside of office hours, we are not available 24/7.   If you have an urgent issue and are unable to reach [place], you may choose to seek medical care at your doctor's office, retail clinic, urgent care center, or emergency room.  If you have a medical emergency, please immediately call 911 or go to the emergency department.  Pager Numbers  - Dr. Gwen Pounds: [phone]  - Dr. Roseanne Reno: [phone]  - Dr. Katrinka Blazing: [phone]   In the event of inclement weather, please call our main line at [phone] for an update on the status of any delays or closures.  Dermatology Medication Tips: Please keep the boxes that topical medications come in in order to help keep track of the instructions about where and how to use these. Pharmacies typically print the medication instructions only on the boxes and not directly on the medication tubes.   If your medication is too expensive,  please contact our office at [phone] option 4 or send [place] a message through MyChart.   We are unable to tell what your co-pay for medications will be in advance as this is different depending on your insurance coverage. However, we may be able to find a substitute medication at lower cost or fill out paperwork to  get insurance to cover a needed medication.   If a prior authorization is required to get your medication covered by your insurance company, please allow [place] 1-2 business days to complete this process.  Drug prices often vary depending on where the prescription is filled and some pharmacies may offer cheaper prices.  The website www.goodrx.com contains coupons for medications through different pharmacies. The prices here do not account for what the cost may be with help from insurance (it may be cheaper with your insurance), but the website can give you the price if you did not use any insurance.  - You can print the associated coupon and take it with your prescription to the pharmacy.  - You may also stop by our office during regular business hours and pick up a GoodRx coupon card.  - If you need your prescription sent electronically to a different pharmacy, notify our office through [facility] or by phone at [phone] option 4.     Si Usted Necesita Algo Despus de Su Visita  Tambin puede enviarnos un mensaje a travs de Clinical cytogeneticist. Por lo general respondemos a los mensajes de MyChart en el transcurso de 1 a 2 das hbiles.  Para renovar recetas, por favor pida a su farmacia que se ponga en contacto con nuestra oficina. Annie Sable de fax es [place] [phone].  Si tiene un asunto urgente cuando la clnica est cerrada y que no puede esperar hasta el siguiente da hbil, puede llamar/localizar a su doctor(a) al nmero que aparece a continuacin.   Por favor, tenga en cuenta que aunque hacemos todo lo posible para estar disponibles para asuntos urgentes fuera del horario de [place], no estamos disponibles las 24 horas del da, los 7 [address]  [address] de la [place].   Si tiene un problema urgente y no puede comunicarse con nosotros, puede optar por buscar atencin mdica  en el consultorio de su doctor(a), en una clnica privada, en un centro de atencin urgente o en una sala de emergencias.  Si  tiene Engineer, drilling, por favor llame inmediatamente al 911 o vaya a la sala de emergencias.  Nmeros de bper  - Dr. Gwen Pounds: [phone]  - Dra. Roseanne Reno: [phone]  - Dr. Katrinka Blazing: [phone]   En caso de inclemencias del tiempo, por favor llame a Lacy Duverney principal al [phone] para una actualizacin sobre el [place] de cualquier retraso o cierre.  Consejos para la medicacin en dermatologa: Por favor, guarde las cajas en las que vienen los medicamentos de uso tpico para ayudarle a seguir las instrucciones sobre dnde y cmo usarlos. Las farmacias generalmente imprimen las instrucciones del medicamento slo en las cajas y no directamente en los tubos del [place].   Si su medicamento es muy caro, por favor, pngase en contacto con Rolm [place]ala llamando al [phone] y presione la opcin 4 o envenos un mensaje a travs de Clinical cytogeneticist.   No podemos decirle cul ser su copago por los medicamentos por adelantado ya que esto es diferente dependiendo de la cobertura de su seguro. Sin embargo, es posible que podamos encontrar un medicamento sustituto a Audiological scientist un formulario para que el seguro  cubra el medicamento que se considera necesario.   Si se requiere una autorizacin previa para que su compaa de seguros [place] su medicamento, por favor permtanos de 1 a 2 das hbiles para completar [address]  Los precios de los medicamentos varan con frecuencia dependiendo del Environmental consultant de dnde se surte la receta y alguna farmacias pueden ofrecer precios ms baratos.  El sitio web www.goodrx.com tiene cupones para medicamentos de Health and safety inspector. Los precios aqu no tienen en cuenta lo que podra costar con la ayuda del seguro (puede ser ms barato con su seguro), pero el sitio web puede darle el precio si no utiliz Tourist information centre manager.  - Puede imprimir el cupn correspondiente y llevarlo con su receta a la farmacia.  - Tambin puede pasar por nuestra oficina  durante el horario de atencin regular y Education officer, museum una tarjeta de cupones de GoodRx.  - Si necesita que su receta se enve electrnicamente a una farmacia diferente, informe a nuestra oficina a travs de MyChart de [place] o por telfono llamando al [phone] y presione la opcin 4.

## 2023-05-04 ENCOUNTER — Telehealth: Payer: Self-pay

## 2023-05-04 DIAGNOSIS — Z3201 Encounter for pregnancy test, result positive: Secondary | ICD-10-CM | POA: Diagnosis not present

## 2023-05-04 NOTE — Telephone Encounter (Signed)
iPledge called office today to confirm positive pregnancy test and report in iPledge system.  Per iPledge after positive test, patient has to wait 60 days after negative pregnancy test to re register.  Additional details provided to [facility] with iPledge. aw

## 2023-05-05 ENCOUNTER — Telehealth: Payer: Self-pay

## 2023-05-05 LAB — CBC WITH DIFFERENTIAL/PLATELET
Basophils Absolute: 0 10*3/uL (ref 0.0–0.2)
Basos: 0 %
EOS (ABSOLUTE): 0.1 10*3/uL (ref 0.0–0.4)
Eos: 1 %
Hematocrit: 40.7 % (ref 34.0–46.6)
Hemoglobin: 13.8 g/dL (ref 11.1–15.9)
Immature Grans (Abs): 0 10*3/uL (ref 0.0–0.1)
Immature Granulocytes: 0 %
Lymphocytes Absolute: 2.4 10*3/uL (ref 0.7–3.1)
Lymphs: 35 %
MCH: 31.2 pg (ref 26.6–33.0)
MCHC: 33.9 g/dL (ref 31.5–35.7)
MCV: 92 fL (ref 79–97)
Monocytes Absolute: 0.6 10*3/uL (ref 0.1–0.9)
Monocytes: 8 %
Neutrophils Absolute: 3.8 10*3/uL (ref 1.4–7.0)
Neutrophils: 56 %
Platelets: 287 10*3/uL (ref 150–450)
RBC: 4.43 x10E6/uL (ref 3.77–5.28)
RDW: 12.3 % (ref 11.7–15.4)
WBC: 6.9 10*3/uL (ref 3.4–10.8)

## 2023-05-05 LAB — COMPREHENSIVE METABOLIC PANEL
ALT: 10 IU/L (ref 0–32)
AST: 19 IU/L (ref 0–40)
Albumin: 4.9 g/dL (ref 4.0–5.0)
Alkaline Phosphatase: 56 IU/L (ref 44–121)
BUN/Creatinine Ratio: 16 (ref 9–23)
BUN: 11 mg/dL (ref 6–20)
Bilirubin Total: 0.3 mg/dL (ref 0.0–1.2)
CO2: 20 mmol/L (ref 20–29)
Calcium: 9.9 mg/dL (ref 8.7–10.2)
Chloride: 102 mmol/L (ref 96–106)
Creatinine, Ser: 0.67 mg/dL (ref 0.57–1.00)
Globulin, Total: 2.9 g/dL (ref 1.5–4.5)
Glucose: 94 mg/dL (ref 70–99)
Potassium: 4.4 mmol/L (ref 3.5–5.2)
Sodium: 140 mmol/L (ref 134–144)
Total Protein: 7.8 g/dL (ref 6.0–8.5)
eGFR: 126 mL/min/{1.73_m2} (ref >59)

## 2023-05-05 LAB — HCG, SERUM, QUALITATIVE: hCG,Beta Subunit,Qual,Serum: POSITIVE m[IU]/mL — AB (ref <6)

## 2023-05-05 NOTE — Telephone Encounter (Addendum)
Called patient, VM box full. aw

## 2023-05-05 NOTE — Telephone Encounter (Signed)
Patient advised of information per Dr. Katrinka Blazing and appointment has been cancelled with Dr. Katrinka Blazing and rescheduled with Dr. Roseanne Reno. aw

## 2023-05-05 NOTE — Telephone Encounter (Signed)
-----   Message from [place] sent at 05/05/2023  8:25 AM EDT ----- Blood counts, electrolytes, kidney function, liver function normal. Serum pregnancy positive as expected. Please reschedule follow up with Dr Roseanne Reno

## 2023-05-30 ENCOUNTER — Ambulatory Visit: Payer: Commercial Managed Care - PPO | Admitting: Obstetrics

## 2023-05-30 ENCOUNTER — Encounter: Payer: Self-pay | Admitting: Obstetrics

## 2023-05-30 VITALS — BP 106/74 | HR 73 | Ht 61.0 in | Wt 109.0 lb

## 2023-05-30 DIAGNOSIS — L03314 Cellulitis of groin: Secondary | ICD-10-CM

## 2023-05-30 DIAGNOSIS — L0390 Cellulitis, unspecified: Secondary | ICD-10-CM | POA: Insufficient documentation

## 2023-05-30 DIAGNOSIS — N764 Abscess of vulva: Secondary | ICD-10-CM | POA: Diagnosis not present

## 2023-05-30 DIAGNOSIS — L739 Follicular disorder, unspecified: Secondary | ICD-10-CM

## 2023-05-30 DIAGNOSIS — L02214 Cutaneous abscess of groin: Secondary | ICD-10-CM

## 2023-05-30 MED ORDER — CLINDAMYCIN PHOSPHATE 1 % EX SOLN
Freq: Two times a day (BID) | CUTANEOUS | 0 refills | Status: DC
Start: 2023-05-30 — End: 2024-05-15

## 2023-05-30 MED ORDER — SULFAMETHOXAZOLE-TRIMETHOPRIM 800-160 MG PO TABS
1.0000 | ORAL_TABLET | Freq: Two times a day (BID) | ORAL | 0 refills | Status: AC
Start: 2023-05-30 — End: 2023-06-04

## 2023-05-30 MED ORDER — FLUCONAZOLE 150 MG PO TABS
150.0000 mg | ORAL_TABLET | Freq: Once | ORAL | 0 refills | Status: AC
Start: 2023-05-30 — End: 2023-05-30

## 2023-05-30 NOTE — Progress Notes (Signed)
   GYN ENCOUNTER  Subjective  HPI: Shelly Knight is a 22 y.o. G1P0 who presents today for evaluation of a cyst in her groin. She reports that she had an abscess in July that was drained. Then she developed another cyst above this. It has been leaking fluid, blood, and purulent discharge. It is painful to the touch and she has been feeling generally unwell. She denies other lesions, vaginal discharge, or itching. She reports an exposure to HSV but that serology 8 months after exposure was negative. She does report shaving her pubic area and that shaving irritates the bump.  Past Medical History:  Diagnosis Date   Acne    Accutane therapy. iPledge: [id number]   Anxiety    Asthma    Depression    No past surgical history on file. OB History     Gravida  1   Para      Term      Preterm      AB      Living         SAB      IAB      Ectopic      Multiple      Live Births             Allergies  Allergen Reactions   Omnicef [Cefdinir] Rash    Constitutional: Denied night sweats, recent illness, fatigue, fever, insomnia and weight loss. +malaise  Genitourinary: See HPI    Objective  BP 106/74   Pulse 73   Ht 5\' 1"  (1.549 m)   Wt 109 lb (49.4 kg)   LMP 05/02/2023 (Approximate)   BMI 20.60 kg/m   Physical examination Vulva: Normal appearance.  No lesions. No discharge.  Skin Left inguinal fold with raised, erythematous area approximately 3 cm long with pustule near apex and small area draining serosanguinous fluid below. Tender to palpation.    Assessment  Cellulitis/folliculitis Possible hidradenitis suppurativa  Plan  -Rx sent for Bactrim DS BID x 5 days.  -Discussed hygiene, avoiding scented soaps, shaving, tight clothing. Recommend warm compresses, hydrocortisone as needed. -Discussed that recurrence may indicate hydradenitis suppurativa. Reviewed care and rx for topical clindamycin sent.  Guadlupe Spanish, CNM

## 2023-06-02 DIAGNOSIS — Z0000 Encounter for general adult medical examination without abnormal findings: Secondary | ICD-10-CM | POA: Diagnosis not present

## 2023-06-02 DIAGNOSIS — F4310 Post-traumatic stress disorder, unspecified: Secondary | ICD-10-CM | POA: Diagnosis not present

## 2023-06-02 DIAGNOSIS — F411 Generalized anxiety disorder: Secondary | ICD-10-CM | POA: Diagnosis not present

## 2023-06-02 DIAGNOSIS — Z118 Encounter for screening for other infectious and parasitic diseases: Secondary | ICD-10-CM | POA: Diagnosis not present

## 2023-06-02 DIAGNOSIS — R300 Dysuria: Secondary | ICD-10-CM | POA: Diagnosis not present

## 2023-06-02 DIAGNOSIS — F331 Major depressive disorder, recurrent, moderate: Secondary | ICD-10-CM | POA: Diagnosis not present

## 2023-06-02 LAB — HERPES SIMPLEX VIRUS CULTURE

## 2023-06-02 LAB — WOUND CULTURE: Organism ID, Bacteria: NONE SEEN

## 2023-06-03 ENCOUNTER — Encounter: Payer: Self-pay | Admitting: Obstetrics

## 2023-06-06 ENCOUNTER — Telehealth: Payer: Self-pay

## 2023-06-06 ENCOUNTER — Ambulatory Visit: Payer: Commercial Managed Care - PPO | Admitting: Dermatology

## 2023-06-06 NOTE — Telephone Encounter (Signed)
Patient called nurse line and lef VM to return call. Called patient, no answer, VM full. aw

## 2023-06-13 ENCOUNTER — Ambulatory Visit (INDEPENDENT_AMBULATORY_CARE_PROVIDER_SITE_OTHER): Payer: Commercial Managed Care - PPO | Admitting: Dermatology

## 2023-06-13 DIAGNOSIS — L700 Acne vulgaris: Secondary | ICD-10-CM

## 2023-06-13 MED ORDER — WINLEVI 1 % EX CREA: TOPICAL_CREAM | CUTANEOUS | 2 refills | Status: DC

## 2023-06-13 MED ORDER — SPIRONOLACTONE 100 MG PO TABS: 100.0000 mg | ORAL_TABLET | Freq: Every day | ORAL | 2 refills | Status: DC

## 2023-06-13 MED ORDER — TRETINOIN 0.05 % EX CREA: TOPICAL_CREAM | Freq: Every day | CUTANEOUS | 2 refills | Status: DC

## 2023-06-13 NOTE — Patient Instructions (Signed)

## 2023-06-13 NOTE — Progress Notes (Signed)
Follow-Up Visit   Subjective  Shelly Knight is a 22 y.o. female who presents for the following: 2 months f/u on Acne on her face and body, treating her face with Clindamycin solution with fair response, patient report she will break out in acne flares all over her body and face during her menstrual cycle, patient would like to re start Isotretinoin therapy she report Isotretinoin is the only medication that will help her acne. Pt had positive pregnancy test/miscarriage while on isotretinoin and is locked out of IPLEDGE.  She has been on and off isotretinoin for past couple years.  She is currently taking oral contraceptive    The following portions of the chart were reviewed this encounter and updated as appropriate: medications, allergies, medical history  Review of Systems:  No other skin or systemic complaints except as noted in HPI or Assessment and Plan.  Objective  Well appearing patient in no apparent distress; mood and affect are within normal limits.  Areas Examined: Face, chest and back  Relevant exam findings are noted in the Assessment and Plan.   Assessment & Plan   ACNE VULGARIS Exam: few closed comedones on the cheeks and forehead   Chronic and persistent condition with duration or expected duration over one year. Condition is symptomatic/ bothersome to patient. Improved but not currently at goal.   Treatment Plan: Due to recent pregnancy with miscarriage while on Isotretinoin and h/o noncompliance with f/up appointments, we will not restart Isotretinoin. In addition, pt is locked out of IPLEDGE due to positive pregnancy test.   Restart Spironolactone 100 mg take 1 tablet daily, to begin take 1/2 tablet daily for a week.  Spironolactone can cause increased urination and cause blood pressure to decrease. Please watch for signs of lightheadedness and be cautious when changing position. It can sometimes cause breast tenderness or an irregular period in  premenopausal women. It can also increase potassium. The increase in potassium usually is not a concern unless you are taking other medicines that also increase potassium, so please be sure your doctor knows all of the other medications you are taking. This medication should not be taken by pregnant women.  This medicine should also not be taken together with sulfa drugs like Bactrim (trimethoprim/sulfamethexazole).    Continue Clindamycin solution qd-bid  Start Winlevi cream apply to face twice a day  Start Tretinoin .05% cream apply to face at bedtime as tolerated   Topical retinoid medications like tretinoin/Retin-A, adapalene/Differin, tazarotene/Fabior, and Epiduo/Epiduo Forte can cause dryness and irritation when first started. Only apply a pea-sized amount to the entire affected area. Avoid applying it around the eyes, edges of mouth and creases at the nose. If you experience irritation, use a good moisturizer first and/or apply the medicine less often. If you are doing well with the medicine, you can increase how often you use it until you are applying every night. Be careful with sun protection while using this medication as it can make you sensitive to the sun. This medicine should not be used by pregnant women.                                                          Return in about 3 months (around 09/12/2023) for acne .  Cipriano Bunker, CMA, am acting as scribe  for Willeen Niece, MD .   Documentation: I have reviewed the above documentation for accuracy and completeness, and I agree with the above.  Willeen Niece, MD

## 2023-07-22 DIAGNOSIS — B9689 Other specified bacterial agents as the cause of diseases classified elsewhere: Secondary | ICD-10-CM | POA: Diagnosis not present

## 2023-07-22 DIAGNOSIS — J019 Acute sinusitis, unspecified: Secondary | ICD-10-CM | POA: Diagnosis not present

## 2023-07-22 DIAGNOSIS — L239 Allergic contact dermatitis, unspecified cause: Secondary | ICD-10-CM | POA: Diagnosis not present

## 2023-07-22 DIAGNOSIS — Z03818 Encounter for observation for suspected exposure to other biological agents ruled out: Secondary | ICD-10-CM | POA: Diagnosis not present

## 2023-07-22 DIAGNOSIS — J209 Acute bronchitis, unspecified: Secondary | ICD-10-CM | POA: Diagnosis not present

## 2023-09-12 DIAGNOSIS — Z03818 Encounter for observation for suspected exposure to other biological agents ruled out: Secondary | ICD-10-CM | POA: Diagnosis not present

## 2023-09-12 DIAGNOSIS — J029 Acute pharyngitis, unspecified: Secondary | ICD-10-CM | POA: Diagnosis not present

## 2023-09-12 DIAGNOSIS — J019 Acute sinusitis, unspecified: Secondary | ICD-10-CM | POA: Diagnosis not present

## 2023-10-04 ENCOUNTER — Ambulatory Visit: Payer: Commercial Managed Care - PPO | Admitting: Dermatology

## 2023-10-09 ENCOUNTER — Other Ambulatory Visit: Payer: Self-pay | Admitting: Dermatology

## 2023-11-14 ENCOUNTER — Ambulatory Visit (INDEPENDENT_AMBULATORY_CARE_PROVIDER_SITE_OTHER): Payer: Commercial Managed Care - PPO | Admitting: Dermatology

## 2023-11-14 DIAGNOSIS — L700 Acne vulgaris: Secondary | ICD-10-CM | POA: Diagnosis not present

## 2023-11-14 DIAGNOSIS — Z79899 Other long term (current) drug therapy: Secondary | ICD-10-CM | POA: Diagnosis not present

## 2023-11-14 MED ORDER — SPIRONOLACTONE 100 MG PO TABS: 100.0000 mg | ORAL_TABLET | Freq: Every day | ORAL | 5 refills | Status: DC

## 2023-11-14 MED ORDER — WINLEVI 1 % EX CREA: TOPICAL_CREAM | CUTANEOUS | 3 refills | Status: DC

## 2023-11-14 MED ORDER — DOXYCYCLINE MONOHYDRATE 100 MG PO CAPS: 100.0000 mg | ORAL_CAPSULE | Freq: Every day | ORAL | 1 refills | Status: DC

## 2023-11-14 MED ORDER — CLINDAMYCIN PHOSPHATE 1 % EX LOTN: TOPICAL_LOTION | Freq: Every day | CUTANEOUS | 3 refills | Status: AC

## 2023-11-14 MED ORDER — TRETINOIN 0.05 % EX CREA: TOPICAL_CREAM | Freq: Every day | CUTANEOUS | 2 refills | Status: DC

## 2023-11-14 NOTE — Progress Notes (Signed)
 Follow-Up Visit   Subjective  Shelly Knight is a 23 y.o. female who presents for the following: Acne face, back, chest, Clindamycin solution qd to face only, ran out of Spironolactone about a month ago, and she has broken out since then, especially on upper back. Bumps are painful.  The following portions of the chart were reviewed this encounter and updated as appropriate: medications, allergies, medical history  Review of Systems:  No other skin or systemic complaints except as noted in HPI or Assessment and Plan.  Objective  Well appearing patient in no apparent distress; mood and affect are within normal limits.   A focused examination was performed of the following areas: face  Relevant exam findings are noted in the Assessment and Plan.    Assessment & Plan   ACNE VULGARIS Face, back, chest Exam: multiple inflamed comedones upper back, post neck, greater than shoulders, chest, face  Chronic and persistent condition with duration or expected duration over one year. Condition is bothersome/symptomatic for patient. Currently flared.   Treatment Plan: Restart Tretinoin 0.05% cr qhs to back (not able to tolerate at face) Start Clindamycin lotion qd to face, back, chest Restart Spironolactone 100mg  1 po qd Restart Winlevi cream bid to face Start Doxycycline 100mg  1 po qd for 1 month with food and drink Start otc BP wash in shower, CeraVe samples given  Recommend OTC benzoyl peroxide cleanser, wash affected areas daily in shower, let sit several minutes prior to rinsing.  May bleach towels if not rinsed off completely.  Recommended brands include Panoxyl 4% Creamy Wash, CeraVe Acne Foaming Cream wash, or Cetaphil Gentle Clear Complexion-Clearing BPO Acne Cleanser.   Topical retinoid medications like tretinoin/Retin-A, adapalene/Differin, tazarotene/Fabior, and Epiduo/Epiduo Forte can cause dryness and irritation when first started. Only apply a pea-sized amount to the  entire affected area. Avoid applying it around the eyes, edges of mouth and creases at the nose. If you experience irritation, use a good moisturizer first and/or apply the medicine less often. If you are doing well with the medicine, you can increase how often you use it until you are applying every night. Be careful with sun protection while using this medication as it can make you sensitive to the sun. This medicine should not be used by pregnant women.    Spironolactone can cause increased urination and cause blood pressure to decrease. Please watch for signs of lightheadedness and be cautious when changing position. It can sometimes cause breast tenderness or an irregular period in premenopausal women. It can also increase potassium. The increase in potassium usually is not a concern unless you are taking other medicines that also increase potassium, so please be sure your doctor knows all of the other medications you are taking. This medication should not be taken by pregnant women.  This medicine should also not be taken together with sulfa drugs like Bactrim (trimethoprim/sulfamethexazole).   Doxycycline should be taken with food to prevent nausea. Do not lay down for 30 minutes after taking. Be cautious with sun exposure and use good sun protection while on this medication. Pregnant women should not take this medication.   Benzoyl peroxide can cause dryness and irritation of the skin. It can also bleach fabric. When used together with Aczone (dapsone) cream, it can stain the skin orange.  Long term medication management.  Patient is using long term (months to years) prescription medication  to control their dermatologic condition.  These medications require periodic monitoring to evaluate for efficacy and side  effects and may require periodic laboratory monitoring.    ACNE VULGARIS    Return in about 6 months (around 05/16/2024) for Acne f/u.  I, Ardis Rowan, RMA, am acting as scribe for  Willeen Niece, MD .   Documentation: I have reviewed the above documentation for accuracy and completeness, and I agree with the above.  Willeen Niece, MD

## 2023-11-14 NOTE — Patient Instructions (Addendum)
 Recommend OTC benzoyl peroxide cleanser, wash affected areas daily in shower, let sit several minutes prior to rinsing.  May bleach towels if not rinsed off completely.  Recommended brands include Panoxyl 4% Creamy Wash, CeraVe Acne Foaming Cream wash, or Cetaphil Gentle Clear Complexion-Clearing BPO Acne Cleanser.   Restart Spironolactone 100mg  1 pill a day Start Doxycycline 100mg  1 pill a day for 1 month, take with food and drink Restart Tretinoin 0.05% cream nightly to chest, back and shoulders Restart Winlevi cream twice a day to face Continue Clindamycin lotion once daily to face, chest, back and shoulders Start over the counter Benzoyl Peroxide wash in the shower   Due to recent changes in healthcare laws, you may see results of your pathology and/or laboratory studies on MyChart before the doctors have had a chance to review them. We understand that in some cases there may be results that are confusing or concerning to you. Please understand that not all results are received at the same time and often the doctors may need to interpret multiple results in order to provide you with the best plan of care or course of treatment. Therefore, we ask that you please give [place] 2 business days to thoroughly review all your results before contacting the office for clarification. Should we see a critical lab result, you will be contacted sooner.   If You Need Anything After Your Visit  If you have any questions or concerns for your doctor, please call our main line at [phone] and press option 4 to reach your doctor's medical assistant. If no one answers, please leave a voicemail as directed and we will return your call as soon as possible. Messages left after 4 pm will be answered the following business day.   You may also send [place] a message via MyChart. We typically respond to MyChart messages within 1-2 business days.  For prescription refills, please ask your pharmacy to contact our office. Our  fax number is [phone].  If you have an urgent issue when the clinic is closed that cannot wait until the next business day, you can page your doctor at the number below.    Please note that while we do our best to be available for urgent issues outside of office hours, we are not available 24/7.   If you have an urgent issue and are unable to reach [place], you may choose to seek medical care at your doctor's office, retail clinic, urgent care center, or emergency room.  If you have a medical emergency, please immediately call 911 or go to the emergency department.  Pager Numbers  - Dr. Gwen Pounds: [phone]  - Dr. Roseanne Reno: [phone]  - Dr. Katrinka Blazing: [phone]   In the event of inclement weather, please call our main line at [phone] for an update on the status of any delays or closures.  Dermatology Medication Tips: Please keep the boxes that topical medications come in in order to help keep track of the instructions about where and how to use these. Pharmacies typically print the medication instructions only on the boxes and not directly on the medication tubes.   If your medication is too expensive, please contact our office at [phone] option 4 or send [place] a message through MyChart.   We are unable to tell what your co-pay for medications will be in advance as this is different depending on your insurance coverage. However, we may be able to find a substitute medication at lower cost or fill out paperwork to  get insurance to cover a needed medication.   If a prior authorization is required to get your medication covered by your insurance company, please allow [place] 1-2 business days to complete this process.  Drug prices often vary depending on where the prescription is filled and some pharmacies may offer cheaper prices.  The website www.goodrx.com contains coupons for medications through different pharmacies. The prices here do not account for what the cost may be with  help from insurance (it may be cheaper with your insurance), but the website can give you the price if you did not use any insurance.  - You can print the associated coupon and take it with your prescription to the pharmacy.  - You may also stop by our office during regular business hours and pick up a GoodRx coupon card.  - If you need your prescription sent electronically to a different pharmacy, notify our office through [facility] or by phone at [phone] option 4.     Si Usted Necesita Algo Despus de Su Visita  Tambin puede enviarnos un mensaje a travs de Clinical cytogeneticist. Por lo general respondemos a los mensajes de MyChart en el transcurso de 1 a 2 das hbiles.  Para renovar recetas, por favor pida a su farmacia que se ponga en contacto con nuestra oficina. Annie Sable de fax es [place] [phone].  Si tiene un asunto urgente cuando la clnica est cerrada y que no puede esperar hasta el siguiente da hbil, puede llamar/localizar a su doctor(a) al nmero que aparece a continuacin.   Por favor, tenga en cuenta que aunque hacemos todo lo posible para estar disponibles para asuntos urgentes fuera del horario de [place], no estamos disponibles las 24 horas del da, los 7 [address]  [address] de la [place].   Si tiene un problema urgente y no puede comunicarse con nosotros, puede optar por buscar atencin mdica  en el consultorio de su doctor(a), en una clnica privada, en un centro de atencin urgente o en una sala de emergencias.  Si tiene Engineer, drilling, por favor llame inmediatamente al 911 o vaya a la sala de emergencias.  Nmeros de bper  - Dr. Gwen Pounds: [phone]  - Dra. Roseanne Reno: [phone]  - Dr. Katrinka Blazing: [phone]   En caso de inclemencias del tiempo, por favor llame a Lacy Duverney principal al [phone] para una actualizacin sobre el [place] de cualquier retraso o cierre.  Consejos para la medicacin en dermatologa: Por favor, guarde las cajas en las que  vienen los medicamentos de uso tpico para ayudarle a seguir las instrucciones sobre dnde y cmo usarlos. Las farmacias generalmente imprimen las instrucciones del medicamento slo en las cajas y no directamente en los tubos del [place].   Si su medicamento es muy caro, por favor, pngase en contacto con Rolm Gala llamando al [phone] y presione la opcin 4 o envenos un mensaje a travs de Clinical cytogeneticist.   No podemos decirle cul ser su copago por los medicamentos por adelantado ya que esto es diferente dependiendo de la cobertura de su seguro. Sin embargo, es posible que podamos encontrar un medicamento sustituto a Audiological scientist un formulario para que el seguro cubra el medicamento que se considera necesario.   Si se requiere una autorizacin previa para que su compaa de seguros [place] su medicamento, por favor permtanos de 1 a 2 das hbiles para completar [address]  Los precios de los medicamentos varan con frecuencia dependiendo del Environmental consultant de dnde se surte la receta y [place]  pueden ofrecer precios ms baratos.  El sitio web www.goodrx.com tiene cupones para medicamentos de Health and safety inspector. Los precios aqu no tienen en cuenta lo que podra costar con la ayuda del seguro (puede ser ms barato con su seguro), pero el sitio web puede darle el precio si no utiliz Tourist information centre manager.  - Puede imprimir el cupn correspondiente y llevarlo con su receta a la farmacia.  - Tambin puede pasar por nuestra oficina durante el horario de atencin regular y Education officer, museum una tarjeta de cupones de GoodRx.  - Si necesita que su receta se enve electrnicamente a una farmacia diferente, informe a nuestra oficina a travs de MyChart de [place] o por telfono llamando al [phone] y presione la opcin 4.

## 2023-12-19 DIAGNOSIS — F4310 Post-traumatic stress disorder, unspecified: Secondary | ICD-10-CM | POA: Diagnosis not present

## 2023-12-19 DIAGNOSIS — F331 Major depressive disorder, recurrent, moderate: Secondary | ICD-10-CM | POA: Diagnosis not present

## 2023-12-19 DIAGNOSIS — F411 Generalized anxiety disorder: Secondary | ICD-10-CM | POA: Diagnosis not present

## 2023-12-30 ENCOUNTER — Ambulatory Visit
Admission: EM | Admit: 2023-12-30 | Discharge: 2023-12-30 | Disposition: A | Discharge status: Home / Self Care | Attending: Emergency Medicine | Admitting: Emergency Medicine

## 2023-12-30 DIAGNOSIS — N898 Other specified noninflammatory disorders of vagina: Secondary | ICD-10-CM

## 2023-12-30 DIAGNOSIS — Z3202 Encounter for pregnancy test, result negative: Secondary | ICD-10-CM | POA: Diagnosis not present

## 2023-12-30 DIAGNOSIS — L292 Pruritus vulvae: Secondary | ICD-10-CM | POA: Diagnosis not present

## 2023-12-30 DIAGNOSIS — N766 Ulceration of vulva: Secondary | ICD-10-CM | POA: Diagnosis not present

## 2023-12-30 LAB — POCT URINALYSIS DIP (MANUAL ENTRY)
Bilirubin, UA: NEGATIVE
Blood, UA: NEGATIVE
Glucose, UA: NEGATIVE mg/dL
Ketones, POC UA: NEGATIVE mg/dL
Leukocytes, UA: NEGATIVE
Nitrite, UA: NEGATIVE
Protein Ur, POC: NEGATIVE mg/dL
Spec Grav, UA: 1.01
Urobilinogen, UA: 0.2 U/dL
pH, UA: 6

## 2023-12-30 LAB — POCT URINE PREGNANCY: Preg Test, Ur: NEGATIVE

## 2023-12-30 MED ORDER — VALACYCLOVIR HCL 1 G PO TABS: 1000.0000 mg | ORAL_TABLET | Freq: Two times a day (BID) | ORAL | 0 refills | Status: AC

## 2023-12-30 MED ORDER — FLUCONAZOLE 150 MG PO TABS: 150.0000 mg | ORAL_TABLET | Freq: Once | ORAL | 0 refills | Status: AC

## 2023-12-30 NOTE — Discharge Instructions (Addendum)
 Take the Valtrex  and Diflucan  as directed.  Your tests are pending.  Do not have any sexual activity until all test results are back, all treatment is completed, and all sores have healed.    Follow up with your gynecologist or primary care provider.

## 2023-12-30 NOTE — ED Provider Notes (Signed)
 [name]    CSN: [id number] Arrival date & time: 12/30/23  1525      History   Chief Complaint Chief Complaint  Patient presents with   Exposure to STD   Groin Swelling    HPI Shelly Knight is a 23 y.o. female.   Accompanied by her mother, patient presents with painful genital sores x 3 days.  She also reports vaginal discharge, vaginal itching, tender swollen lymph nodes in her groin, low back pain x 3 days.  No treatments at home.  No pelvic pain, fever, dysuria, hematuria, abdominal pain.  She reports possible exposure to HSV from an ex-boyfriend.  The history is provided by the patient, a parent and medical records.    Past Medical History:  Diagnosis Date   Acne    Accutane  therapy. 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 No pertinent surgical history.  OB History     Gravida  1   Para      Term      Preterm      AB      Living         SAB      IAB      Ectopic      Multiple      Live Births               Home Medications    Prior to Admission medications   Medication Sig Start Date End Date Taking? Authorizing Provider  fluconazole  (DIFLUCAN ) 150 MG tablet Take 1 tablet (150 mg total) by mouth once for 1 dose. 12/30/23 12/30/23 Yes Wellington Half, NP  hydrOXYzine  (ATARAX ) 25 MG tablet Take 25 mg by mouth. 12/19/23 01/18/24 Yes [provider]  valACYclovir  (VALTREX ) 1000 MG tablet Take 1 tablet (1,000 mg total) by mouth 2 (two) times daily for 10 days. 12/30/23 01/09/24 Yes Wellington Half, NP  venlafaxine XR (EFFEXOR-XR) 37.5 MG 24 hr capsule Take 37.5 mg by mouth.  12/19/23 12/18/24 Yes [provider]  Clascoterone  (WINLEVI ) 1 % CREA Apply to face twice a day to face 11/14/23   Artemio Larry, MD  clindamycin  (CLEOCIN  T) 1 % external solution Apply topically 2 (two) times daily. 05/30/23   Phylliss Brenner, CNM  clindamycin  (CLEOCIN -T) 1 % lotion Apply topically daily. qd to face, back and chest for acne 11/14/23 11/13/24  Artemio Larry, MD  doxycycline  (MONODOX ) 100 MG capsule Take 1 capsule (100 mg total) by mouth daily. Take with food and drink 11/14/23   Artemio Larry, MD  norethindrone-ethinyl estradiol (ORTHO-NOVUM 7/7/7, 28,) 0.5/0.75/1-35 MG-MCG tablet Take 1 tablet by mouth daily. 08/09/17   Darl Edu, MD  omeprazole (PRILOSEC) 20 MG capsule Take by mouth. 11/17/18 11/17/19  [provider]  promethazine (PHENERGAN) 25 MG tablet Take 1 tablet by mouth up to every 6 hours as needed for nausea/mild or moderate discomfort.  Do not combine with anxiety med. Patient not taking: Reported on 12/30/2023 11/17/18   [provider]  sertraline  (ZOLOFT ) 50 MG tablet Take 1 tablet (50 mg total) by mouth daily with breakfast. Patient not taking:  Reported on 12/30/2023 04/30/19   Eappen, Saramma, MD  spironolactone  (ALDACTONE ) 100 MG tablet Take 1 tablet (100 mg total) by mouth daily. 11/14/23   Artemio Larry, MD  tretinoin  (RETIN-A ) 0.05 % cream Apply topically at bedtime. qhs to back, chest and shoulders for acne 11/14/23 11/13/24  Artemio Larry, MD    Family History Family History  Problem Relation Age of Onset   Kidney cancer Maternal Uncle    Bipolar disorder Maternal Uncle    Alcohol abuse Maternal Uncle    Breast cancer Other    Bipolar disorder Mother    Anxiety disorder Father    Bipolar disorder Sister    Bipolar disorder Maternal Grandfather     Social History Social History   Tobacco Use   Smoking status: Never   Smokeless tobacco: Never  Vaping Use   Vaping status: Never Used  Substance Use Topics   Alcohol use: No   Drug  use: No     Allergies   Omnicef [cefdinir]   Review of Systems Review of Systems  Constitutional:  Negative for chills and fever.  Gastrointestinal:  Negative for abdominal pain.  Genitourinary:  Positive for vaginal discharge. Negative for dysuria, flank pain, hematuria and pelvic pain.  Musculoskeletal:  Positive for back pain. Negative for gait problem.  Skin:  Positive for rash. Negative for color change.     Physical Exam Triage Vital Signs ED Triage Vitals  Encounter Vitals Group     BP      Systolic BP Percentile      Diastolic BP Percentile      Pulse      Resp      Temp      Temp src      SpO2      Weight      Height      Head Circumference      Peak Flow      Pain Score      Pain Loc      Pain Education      Exclude from Growth Chart    No data found.  Updated Vital Signs BP 109/76   Pulse (!) 102   Temp 98 F (36.7 C)   Resp 18   SpO2 97%   Visual Acuity Right Eye Distance:   Left Eye Distance:   Bilateral Distance:    Right Eye Near:   Left Eye Near:    Bilateral Near:     Physical Exam Exam conducted with a chaperone present Alston Jerry, Charity fundraiser).  Constitutional:      General: She is not in acute distress. HENT:     Mouth/Throat:     Mouth: Mucous membranes are moist.  Cardiovascular:     Rate and Rhythm: Normal rate and regular rhythm.  Pulmonary:     Effort: Pulmonary effort is normal. No respiratory distress.  Abdominal:     Palpations: Abdomen is soft.     Tenderness: There is no abdominal tenderness.  Genitourinary:    Exam position: Lithotomy position.     Vagina: Vaginal discharge present.     Cervix: Normal. No cervical motion tenderness.     Uterus: Normal.      Adnexa: Right adnexa normal and left adnexa normal.     Comments: Multiple tender ulcerated lesions on labia and vaginal introitus.  Small amount of thick white vaginal discharge.  No cervical motion tenderness or adnexal tenderness. Neurological:     Mental Status:  She is alert.  UC Treatments / Results  Labs (all labs ordered are listed, but only abnormal results are displayed) Labs Reviewed  POCT URINE PREGNANCY - Normal  POCT URINALYSIS DIP (MANUAL ENTRY) - Normal  HSV 1/2 PCR (SURFACE)  CERVICOVAGINAL ANCILLARY ONLY    EKG   Radiology No results found.  Procedures Procedures (including critical care time)  Medications Ordered in UC Medications - No data to display  Initial Impression / Assessment and Plan / UC Course  I have reviewed the triage vital signs and the nursing notes.  Pertinent labs & imaging results that were available during my care of the patient were reviewed by me and considered in my medical decision making (see chart for details).   Genital ulcers, vaginal discharge, negative pregnancy test.  Ulcerated lesions swabbed for HSV.  Starting patient on Valtrex .  Discussed that she should stop the Valtrex  if the HSV test is negative.  Vaginal swab obtained for cytology.  1 tablet of Diflucan  also prescribed today.  Discussed with patient that her test results will be available in her MyChart account and that we will call her if additional treatment is needed.  Instructed her to follow-up with her gynecologist or PCP.  Education provided on genital herpes.  Patient agrees to plan of care.    Final Clinical Impressions(s) / UC Diagnoses   Final diagnoses:  Genital ulcer, female  Vaginal discharge  Negative pregnancy test     Discharge Instructions      Take the Valtrex  and Diflucan  as directed.  Your tests are pending.  Do not have any sexual activity until all test results are back, all treatment is completed, and all sores have healed.    Follow up with your gynecologist or primary care provider.      ED Prescriptions     Medication Sig Dispense Auth. Provider   valACYclovir  (VALTREX ) 1000 MG tablet Take 1 tablet (1,000 mg total) by mouth 2 (two) times daily for 10 days. 20 tablet Wellington Half, NP    fluconazole  (DIFLUCAN ) 150 MG tablet Take 1 tablet (150 mg total) by mouth once for 1 dose. 1 tablet Wellington Half, NP      PDMP not reviewed this encounter.   Wellington Half, NP 12/30/23 [phone]

## 2023-12-30 NOTE — ED Triage Notes (Signed)
 Patient to Urgent Care with complaints of groin pain/ lymph node swelling/ lower back pain/ blisters present to her vagina.   Symptoms started 3 days.

## 2023-12-31 LAB — HSV 1/2 PCR (SURFACE)
HSV-1 DNA: DETECTED — AB
HSV-2 DNA: NOT DETECTED

## 2024-01-02 ENCOUNTER — Telehealth (HOSPITAL_COMMUNITY): Payer: Self-pay

## 2024-01-02 LAB — CERVICOVAGINAL ANCILLARY ONLY
Bacterial Vaginitis (gardnerella): POSITIVE — AB
Candida Glabrata: NEGATIVE
Candida Vaginitis: POSITIVE — AB
Chlamydia: NEGATIVE
Comment: NEGATIVE
Comment: NEGATIVE
Comment: NEGATIVE
Comment: NEGATIVE
Comment: NEGATIVE
Comment: NORMAL
Neisseria Gonorrhea: NEGATIVE
Trichomonas: NEGATIVE

## 2024-01-02 MED ORDER — METRONIDAZOLE 500 MG PO TABS
500.0000 mg | ORAL_TABLET | Freq: Two times a day (BID) | ORAL | 0 refills | Status: AC
Start: 2024-01-02 — End: 2024-01-09

## 2024-01-02 NOTE — Telephone Encounter (Signed)
 Per protocol, pt requires tx with metronidazole. Reviewed with patient, verified pharmacy, prescription sent.

## 2024-01-14 ENCOUNTER — Encounter: Payer: Self-pay | Admitting: Emergency Medicine

## 2024-01-14 ENCOUNTER — Ambulatory Visit
Admission: EM | Admit: 2024-01-14 | Discharge: 2024-01-14 | Disposition: A | Discharge status: Home / Self Care | Attending: Family Medicine | Admitting: Family Medicine

## 2024-01-14 DIAGNOSIS — J069 Acute upper respiratory infection, unspecified: Secondary | ICD-10-CM | POA: Insufficient documentation

## 2024-01-14 DIAGNOSIS — J111 Influenza due to unidentified influenza virus with other respiratory manifestations: Secondary | ICD-10-CM | POA: Diagnosis not present

## 2024-01-14 LAB — GROUP A STREP BY PCR: Group A Strep by PCR: NOT DETECTED

## 2024-01-14 LAB — RESP PANEL BY RT-PCR (FLU A&B, COVID) ARPGX2
Influenza A by PCR: NEGATIVE
Influenza B by PCR: NEGATIVE
SARS Coronavirus 2 by RT PCR: NEGATIVE

## 2024-01-14 MED ORDER — PROMETHAZINE-DM 6.25-15 MG/5ML PO SYRP
5.0000 mL | ORAL_SOLUTION | Freq: Four times a day (QID) | ORAL | 0 refills | Status: DC | PRN
Start: 2024-01-14 — End: 2024-02-13

## 2024-01-14 NOTE — Discharge Instructions (Addendum)
 Shelly Knight's strep, influenza and COVID are all negative. You have a viral respiratory infection that will gradually improve over the next 7-10 days. Cough may last up to 3 weeks.   If your were prescribed medication, stop by the pharmacy to pick them up.   You can take Tylenol  and/or Ibuprofen as needed for fever reduction and pain relief.    For cough: honey 1/2 to 1 teaspoon (you can dilute the honey in water or another fluid).  Stop at the pharmacy to pick up your prescription cough medication. You can use a humidifier for chest congestion and cough.  If you don't have a humidifier, you can sit in the bathroom with the hot shower running.      For sore throat: try warm salt water gargles, Mucinex sore throat cough drops or cepacol lozenges, throat spray, warm tea or water with lemon/honey, popsicles or ice, or OTC cold relief medicine for throat discomfort. You can also purchase chloraseptic spray at the pharmacy or dollar store.   For congestion: take a daily anti-histamine like Zyrtec, Claritin, and a oral decongestant, such as pseudoephedrine.  You can also use Flonase 1-2 sprays in each nostril daily. Afrin is also a good option, if you do not have high blood pressure.    It is important to stay hydrated: drink plenty of fluids (water, gatorade/powerade/pedialyte, juices, or teas) to keep your throat moisturized and help further relieve irritation/discomfort.    Return or go to the Emergency Department if symptoms worsen or do not improve in the next few days

## 2024-01-14 NOTE — ED Triage Notes (Signed)
 Patient c/o sore throat, bodyaches, nasal congestion that started on Thursday.  Patient unsure of fevers.

## 2024-01-14 NOTE — ED Provider Notes (Signed)
 [facility]    CSN: [id number] Arrival date & time: 01/14/24  1535      History   Chief Complaint No chief complaint on file.   HPI Shelly Knight is a 23 y.o. female.   HPI  History obtained from the patient. Shelly Knight presents for sore throat, nasal congestion, body aches, fever, chills and headache that started 2 days ago. No known sick contacts. Has some diarrhea but no vomiting. Tried DayQuil and NyQuil.  No medications today.  She felt warm but didn't take her temperature.  Has some slight shortness of breath but no chest pain.        Past Medical History:  Diagnosis Date   Acne    Accutane  therapy. 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  OB History     Gravida  1   Para      Term      Preterm      AB      Living         SAB      IAB      Ectopic      Multiple      Live Births               Home Medications    Prior to Admission medications   Medication Sig Start Date End Date Taking? Authorizing Provider  Clascoterone  (WINLEVI ) 1 % CREA Apply to face twice a day to face 11/14/23   Artemio Larry, MD  clindamycin  (CLEOCIN  T) 1 % external solution Apply topically 2 (two) times daily. 05/30/23   Phylliss Brenner, CNM  clindamycin  (CLEOCIN -T) 1 % lotion Apply topically daily. qd to face, back and chest for acne 11/14/23 11/13/24  Artemio Larry, MD  doxycycline  (MONODOX ) 100 MG capsule Take 1 capsule (100 mg total) by mouth daily. Take with food and drink 11/14/23   Artemio Larry, MD  hydrOXYzine  (ATARAX ) 25 MG tablet Take 25 mg by mouth. 12/19/23 01/18/24   [provider]  norethindrone-ethinyl estradiol (ORTHO-NOVUM 7/7/7, 28,) 0.5/0.75/1-35 MG-MCG tablet Take 1 tablet by mouth daily. 08/09/17   Darl Edu, MD  omeprazole (PRILOSEC) 20 MG capsule Take by mouth. 11/17/18 11/17/19  [provider]  promethazine (PHENERGAN) 25 MG tablet Take 1 tablet by mouth up to every 6 hours as needed for nausea/mild or moderate discomfort.  Do not combine with anxiety med. Patient not taking: Reported on 12/30/2023 11/17/18   [provider]  sertraline  (ZOLOFT ) 50 MG tablet Take 1 tablet (50 mg total) by mouth daily with breakfast. Patient not taking: Reported on 12/30/2023 04/30/19   Eappen, Saramma, MD  spironolactone  (ALDACTONE ) 100 MG tablet Take 1 tablet (100 mg total) by mouth daily. 11/14/23   Artemio Larry, MD  tretinoin  (RETIN-A ) 0.05 % cream Apply topically at bedtime. qhs to back, chest and shoulders for acne 11/14/23 11/13/24  Stewart, Tara, MD  venlafaxine XR (EFFEXOR-XR) 37.5 MG 24 hr capsule Take 37.5 mg by mouth. 12/19/23 12/18/24  [provider]    Family History Family History  Problem Relation Age of Onset  Kidney cancer Maternal Uncle    Bipolar disorder Maternal Uncle    Alcohol abuse Maternal Uncle    Breast cancer Other    Bipolar disorder Mother    Anxiety disorder Father    Bipolar disorder Sister    Bipolar disorder Maternal Grandfather     Social History Social History   Tobacco Use   Smoking status: Never   Smokeless tobacco: Never  Vaping Use   Vaping status: Never Used  Substance Use Topics   Alcohol use: No   Drug use: No     Allergies   Omnicef [cefdinir]   Review of Systems Review of Systems: negative unless otherwise stated in HPI.      Physical Exam Triage Vital Signs ED Triage Vitals  Encounter Vitals Group     BP      Systolic BP Percentile      Diastolic BP Percentile      Pulse      Resp      Temp      Temp src      SpO2      Weight      Height      Head  Circumference      Peak Flow      Pain Score      Pain Loc      Pain Education      Exclude from Growth Chart    No data found.  Updated Vital Signs There were no vitals taken for this visit.  Visual Acuity Right Eye Distance:   Left Eye Distance:   Bilateral Distance:    Right Eye Near:   Left Eye Near:    Bilateral Near:     Physical Exam GEN:     alert, non-toxic appearing female in no distress ***   HENT:  mucus membranes moist, oropharyngeal ***without lesions or ***erythema, no*** tonsillar hypertrophy or exudates, *** moderate erythematous edematous turbinates, ***clear nasal discharge, ***bilateral TM normal EYES:   pupils equal and reactive, ***no scleral injection or discharge NECK:  normal ROM, no ***lymphadenopathy, ***no meningismus   RESP:  no increased work of breathing, ***clear to auscultation bilaterally CVS:   regular rate ***and rhythm Skin:   warm and dry, no rash on visible skin***    UC Treatments / Results  Labs (all labs ordered are listed, but only abnormal results are displayed) Labs Reviewed - No data to display  EKG   Radiology No results found.  Procedures Procedures (including critical care time)  Medications Ordered in UC Medications - No data to display  Initial Impression / Assessment and Plan / UC Course  I have reviewed the triage vital signs and the nursing notes.  Pertinent labs & imaging results that were available during my care of the patient were reviewed by me and considered in my medical decision making (see chart for details).       Pt is a 23 y.o. female who presents for *** days of respiratory symptoms. Lennette is ***afebrile here without recent antipyretics. Satting well on room air. Overall pt is ***non-toxic appearing, well hydrated, without respiratory distress. Pulmonary exam ***is unremarkable.  COVID and influenza panel obtained ***and was negative. ***Pt to quarantine until COVID test results or longer  if positive.  I will call patient with test results, if positive. History consistent with ***viral respiratory illness. Discussed symptomatic treatment.  Explained lack of efficacy of antibiotics in viral disease.  Typical duration of symptoms discussed.   Return  and ED precautions given and voiced understanding. Discussed MDM, treatment plan and plan for follow-up with patient*** who agrees with plan.     Final Clinical Impressions(s) / UC Diagnoses   Final diagnoses:  None   Discharge Instructions   None    ED Prescriptions   None    PDMP not reviewed this encounter.

## 2024-02-09 ENCOUNTER — Ambulatory Visit
Admission: RE | Admit: 2024-02-09 | Discharge: 2024-02-09 | Disposition: A | Discharge status: Home / Self Care | Payer: Self-pay | Source: Ambulatory Visit | Attending: Emergency Medicine | Admitting: Emergency Medicine

## 2024-02-09 DIAGNOSIS — N898 Other specified noninflammatory disorders of vagina: Secondary | ICD-10-CM | POA: Insufficient documentation

## 2024-02-09 DIAGNOSIS — A6009 Herpesviral infection of other urogenital tract: Secondary | ICD-10-CM | POA: Diagnosis not present

## 2024-02-09 DIAGNOSIS — R300 Dysuria: Secondary | ICD-10-CM | POA: Insufficient documentation

## 2024-02-09 LAB — POCT URINALYSIS DIP (MANUAL ENTRY)
Bilirubin, UA: NEGATIVE
Blood, UA: NEGATIVE
Glucose, UA: NEGATIVE mg/dL
Ketones, POC UA: NEGATIVE mg/dL
Leukocytes, UA: NEGATIVE
Nitrite, UA: NEGATIVE
Protein Ur, POC: NEGATIVE mg/dL
Spec Grav, UA: 1.015
Urobilinogen, UA: 0.2 U/dL
pH, UA: 7

## 2024-02-09 MED ORDER — FLUCONAZOLE 150 MG PO TABS: 150.0000 mg | ORAL_TABLET | ORAL | 0 refills | Status: DC | PRN

## 2024-02-09 MED ORDER — VALACYCLOVIR HCL 1 G PO TABS: 1000.0000 mg | ORAL_TABLET | Freq: Every day | ORAL | 0 refills | Status: AC

## 2024-02-09 NOTE — ED Provider Notes (Signed)
 Shelly Knight    CSN: [id number] Arrival date & time: 02/09/24  1746      History   Chief Complaint Chief Complaint  Patient presents with   Groin Pain    Entered by patient    HPI Shelly Knight is a 23 y.o. female.   Patient presents for evaluation of dysuria, urinary odor, incomplete bladder emptying, urinary frequency and vaginal itching present for 4 days.  Also currently experiencing genital outbreak, second occurrence since diagnosis of herpes in April 2025.  Has not attempted treatment of symptoms.  Denies vaginal discharge or odor.  Past Medical History:  Diagnosis Date   Acne    Accutane  therapy. 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  OB History     Gravida  1   Para      Term      Preterm      AB      Living         SAB      IAB      Ectopic      Multiple      Live Births               Home Medications    Prior to Admission medications   Medication Sig Start Date End Date Taking? Authorizing Provider  fluconazole  (DIFLUCAN ) 150 MG tablet Take 1 tablet (150 mg total) by mouth every three (3) days as needed for up to 2 doses. 02/09/24  Yes Petina Muraski R, NP  valACYclovir  (VALTREX ) 1000 MG tablet Take 1 tablet (1,000 mg total) by mouth daily for 5 days. 02/09/24 02/14/24 Yes Jaimen Melone, Maybelle Spatz, NP  Clascoterone  (WINLEVI ) 1 % CREA Apply to face twice a day to face 11/14/23   Artemio Larry, MD  clindamycin  (CLEOCIN  T) 1 % external solution Apply topically 2 (two) times daily. 05/30/23   Phylliss Brenner, CNM  clindamycin  (CLEOCIN -T) 1 % lotion Apply  topically daily. qd to face, back and chest for acne 11/14/23 11/13/24  Artemio Larry, MD  norethindrone-ethinyl estradiol (ORTHO-NOVUM 7/7/7, 28,) 0.5/0.75/1-35 MG-MCG tablet Take 1 tablet by mouth daily. 08/09/17   Darl Edu, MD  omeprazole (PRILOSEC) 20 MG capsule Take by mouth. 11/17/18 11/17/19  [provider]  promethazine  (PHENERGAN ) 25 MG tablet Take 1 tablet by mouth up to every 6 hours as needed for nausea/mild or moderate discomfort.  Do not combine with anxiety med. Patient not taking: Reported on 12/30/2023 11/17/18   [provider]  promethazine -dextromethorphan (PROMETHAZINE -DM) 6.25-15 MG/5ML syrup Take 5 mLs by mouth 4 (four) times daily as needed. 01/14/24   Brimage, Vondra, DO  sertraline  (ZOLOFT ) 50 MG tablet Take 1 tablet (50 mg total) by mouth daily with breakfast. Patient not taking: Reported on 12/30/2023 04/30/19   Eappen, Saramma, MD  spironolactone  (ALDACTONE ) 100 MG tablet Take 1 tablet (100 mg total) by mouth daily. 11/14/23   Artemio Larry, MD  tretinoin  (RETIN-A ) 0.05 % cream Apply topically at bedtime. qhs to back, chest and shoulders for acne 11/14/23 11/13/24  Stewart, Tara, MD  venlafaxine XR (EFFEXOR-XR) 37.5 MG  24 hr capsule Take 37.5 mg by mouth. 12/19/23 12/18/24  [provider]    Family History Family History  Problem Relation Age of Onset   Kidney cancer Maternal Uncle    Bipolar disorder Maternal Uncle    Alcohol abuse Maternal Uncle    Breast cancer Other    Bipolar disorder Mother    Anxiety disorder Father    Bipolar disorder Sister    Bipolar disorder Maternal Grandfather     Social History Social History   Tobacco Use   Smoking status: Never   Smokeless tobacco: Never  Vaping Use   Vaping status: Never Used  Substance Use Topics   Alcohol use: No   Drug use: No     Allergies   Omnicef [cefdinir]   Review of Systems Review of Systems   Physical Exam Triage Vital Signs ED Triage Vitals  Encounter Vitals  Group     BP      Systolic BP Percentile      Diastolic BP Percentile      Pulse      Resp      Temp      Temp src      SpO2      Weight      Height      Head Circumference      Peak Flow      Pain Score      Pain Loc      Pain Education      Exclude from Growth Chart    No data found.  Updated Vital Signs LMP 01/07/2024 (Approximate)   Visual Acuity Right Eye Distance:   Left Eye Distance:   Bilateral Distance:    Right Eye Near:   Left Eye Near:    Bilateral Near:     Physical Exam Constitutional:      Appearance: Normal appearance.  HENT:     Head: Normocephalic.  Pulmonary:     Effort: Pulmonary effort is normal.  Abdominal:     Tenderness: There is abdominal tenderness in the suprapubic area. There is no right CVA tenderness, left CVA tenderness or guarding.  Genitourinary:    Comments: Deferred  Neurological:     Mental Status: She is alert and oriented to person, place, and time. Mental status is at baseline.      UC Treatments / Results  Labs (all labs ordered are listed, but only abnormal results are displayed) Labs Reviewed  URINE CULTURE  POCT URINALYSIS DIP (MANUAL ENTRY)  CERVICOVAGINAL ANCILLARY ONLY    EKG   Radiology No results found.  Procedures Procedures (including critical care time)  Medications Ordered in UC Medications - No data to display  Initial Impression / Assessment and Plan / UC Course  I have reviewed the triage vital signs and the nursing notes.  Pertinent labs & imaging results that were available during my care of the patient were reviewed by me and considered in my medical decision making (see chart for details).  Dysuria, itching, herpes simplex of female genitalia  Urinalysis negative, sent for culture, will initiate antibiotics based on results, discussed this with patient, vaginal swab checking for yeast and BV pending as she is experiencing vaginal itching will empirically treat with Diflucan ,  discussed administration, endorses current genital outbreak with known diagnosis of herpes, prescribed valacyclovir , recommended supportive care and advised follow-up with urgent care as needed, patient emotional during visit, given emotional support by staff Final Clinical Impressions(s) / UC Diagnoses  Final diagnoses:  Dysuria  Vaginal itching  Herpes simplex of female genitalia   Discharge Instructions      Today you were evaluated for your urinary and vaginal symptoms  Urinalysis is negative for bladder infection, urine sample has been sent to the lab for 3 days to determine if bacteria will grow, if this occurs you will be notified and antibiotic sent in at time of notification  As you are experiencing vaginal itching we will treat you for yeast, take Diflucan  when you receive your medicine then on day 3 if still having symptoms you may take second dose  For treatment of genital outbreak take valacyclovir  daily for 5 days to reduce the virus in the body which will help to clear symptoms  Vaginal swab checking for yeast and BV pending 2 to 3 days, you will be notified of positive results only and additional treatment sent in at time of notification   In addition:   Avoid baths, hot tubs and whirlpool spas.  Don't use scented or harsh soaps, such as those with deodorant or antibacterial action. Avoid irritants. These include scented tampons and pads. Wipe from front to back after using the toilet.  Don't douche. Your vagina doesn't require cleansing other than normal bathing.  Use a  condom. Wear cotton underwear, this fabric helps absorb moisture    ED Prescriptions     Medication Sig Dispense Auth. Provider   valACYclovir  (VALTREX ) 1000 MG tablet Take 1 tablet (1,000 mg total) by mouth daily for 5 days. 5 tablet Jehieli Brassell R, NP   fluconazole  (DIFLUCAN ) 150 MG tablet Take 1 tablet (150 mg total) by mouth every three (3) days as needed for up to 2 doses. 2 tablet  Ruthene Methvin R, NP      PDMP not reviewed this encounter.   Reena Canning, [place] 02/09/24 [phone]

## 2024-02-09 NOTE — Discharge Instructions (Signed)
 Today you were evaluated for your urinary and vaginal symptoms  Urinalysis is negative for bladder infection, urine sample has been sent to the lab for 3 days to determine if bacteria will grow, if this occurs you will be notified and antibiotic sent in at time of notification  As you are experiencing vaginal itching we will treat you for yeast, take Diflucan  when you receive your medicine then on day 3 if still having symptoms you may take second dose  For treatment of genital outbreak take valacyclovir  daily for 5 days to reduce the virus in the body which will help to clear symptoms  Vaginal swab checking for yeast and BV pending 2 to 3 days, you will be notified of positive results only and additional treatment sent in at time of notification   In addition:   Avoid baths, hot tubs and whirlpool spas.  Don't use scented or harsh soaps, such as those with deodorant or antibacterial action. Avoid irritants. These include scented tampons and pads. Wipe from front to back after using the toilet.  Don't douche. Your vagina doesn't require cleansing other than normal bathing.  Use a  condom. Wear cotton underwear, this fabric helps absorb moisture

## 2024-02-09 NOTE — ED Triage Notes (Signed)
 Triaged by provider

## 2024-02-11 DIAGNOSIS — Z6821 Body mass index (BMI) 21.0-21.9, adult: Secondary | ICD-10-CM | POA: Diagnosis not present

## 2024-02-11 DIAGNOSIS — U071 COVID-19: Secondary | ICD-10-CM | POA: Diagnosis not present

## 2024-02-13 ENCOUNTER — Ambulatory Visit (HOSPITAL_COMMUNITY): Payer: Self-pay

## 2024-02-13 ENCOUNTER — Ambulatory Visit
Admission: RE | Admit: 2024-02-13 | Discharge: 2024-02-13 | Disposition: A | Discharge status: Home / Self Care | Payer: Self-pay | Source: Ambulatory Visit | Attending: Emergency Medicine | Admitting: Emergency Medicine

## 2024-02-13 VITALS — BP 103/69 | HR 105 | Temp 99.3°F | Resp 20 | Ht 61.0 in | Wt 120.0 lb

## 2024-02-13 DIAGNOSIS — U071 COVID-19: Secondary | ICD-10-CM | POA: Diagnosis not present

## 2024-02-13 DIAGNOSIS — J4521 Mild intermittent asthma with (acute) exacerbation: Secondary | ICD-10-CM | POA: Diagnosis not present

## 2024-02-13 LAB — URINE CULTURE: Culture: 20000 — AB

## 2024-02-13 LAB — CERVICOVAGINAL ANCILLARY ONLY
Bacterial Vaginitis (gardnerella): POSITIVE — AB
Candida Glabrata: NEGATIVE
Candida Vaginitis: POSITIVE — AB
Comment: NEGATIVE
Comment: NEGATIVE
Comment: NEGATIVE

## 2024-02-13 MED ORDER — METRONIDAZOLE 500 MG PO TABS: 500.0000 mg | ORAL_TABLET | Freq: Two times a day (BID) | ORAL | 0 refills | Status: AC

## 2024-02-13 MED ORDER — PROMETHAZINE-DM 6.25-15 MG/5ML PO SYRP: 5.0000 mL | ORAL_SOLUTION | Freq: Every evening | ORAL | 0 refills | Status: DC | PRN

## 2024-02-13 MED ORDER — BENZONATATE 100 MG PO CAPS: 100.0000 mg | ORAL_CAPSULE | Freq: Three times a day (TID) | ORAL | 0 refills | Status: DC

## 2024-02-13 MED ORDER — PREDNISONE 10 MG (21) PO TBPK: ORAL_TABLET | Freq: Every day | ORAL | 0 refills | Status: DC

## 2024-02-13 MED ORDER — ALBUTEROL SULFATE HFA 108 (90 BASE) MCG/ACT IN AERS: 2.0000 | INHALATION_SPRAY | RESPIRATORY_TRACT | 0 refills | Status: AC | PRN

## 2024-02-13 NOTE — Discharge Instructions (Signed)
 Your symptoms today are most likely being caused by a virus and should steadily improve in time it can take up to 7 to 10 days before you truly start to see a turnaround however things will get better  Tomorrow take prednisone every morning with food as directed to open and relax the airway, will help with shortness of breath and wheezing  You may use your inhaler taking 2 puffs every 4 hours as needed for shortness of breath and wheezing, can buy multiple videos on social media and various website showing you how to use it  You may use Tessalon pill every 8 hours as needed for cough, may use cough syrup at bedtime to help you sleep    You can take Tylenol  and/or Ibuprofen as needed for fever reduction and pain relief.   For cough: honey 1/2 to 1 teaspoon (you can dilute the honey in water or another fluid).  You can also use guaifenesin and dextromethorphan for cough. You can use a humidifier for chest congestion and cough.  If you don't have a humidifier, you can sit in the bathroom with the hot shower running.      For sore throat: try warm salt water gargles, cepacol lozenges, throat spray, warm tea or water with lemon/honey, popsicles or ice, or OTC cold relief medicine for throat discomfort.   For congestion: take a daily anti-histamine like Zyrtec, Claritin, and a oral decongestant, such as pseudoephedrine.  You can also use Flonase 1-2 sprays in each nostril daily.   It is important to stay hydrated: drink plenty of fluids (water, gatorade/powerade/pedialyte, juices, or teas) to keep your throat moisturized and help further relieve irritation/discomfort.

## 2024-02-13 NOTE — ED Triage Notes (Signed)
 Patient states 4 days of cough/congestion, bodyaches, sore throat, tested positive for covid on Sat at [facility].  Has some chest tightness, thinks she may have asthma flare as well.

## 2024-02-14 NOTE — ED Provider Notes (Signed)
 [name]    CSN: [id number] Arrival date & time: 02/13/24  1821      History   Chief Complaint Chief Complaint  Patient presents with   Cough    Fever, chills, aching - Entered by patient    HPI Shelly Knight is a 23 y.o. female.   Patient presents for evaluation of fatigue, malaise, fever peaking at 100.2, nasal congestion, productive cough, sore throat, centralized chest tightness, shortness of breath with exertion, intermittent headaches and bilateral ear fullness present for 4 days.  Was evaluated at a different urgent care 2 days ago, testing positive for COVID-19.  Symptoms progressively worsening.  Has attempted using Tylenol  and ibuprofen.  Experiencing decreased appetite but able to tolerate liquids.  History of asthma.  Past Medical History:  Diagnosis Date   Acne    Accutane  therapy. 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 No pertinent surgical history.  OB History     Gravida  1   Para      Term      Preterm      AB      Living         SAB      IAB      Ectopic      Multiple      Live Births               Home Medications    Prior to Admission medications   Medication Sig Start Date End Date Taking? Authorizing Provider  albuterol (VENTOLIN HFA) 108 (90 Base) MCG/ACT inhaler Inhale 2 puffs into the lungs every 4 (four) hours as needed for wheezing or shortness of breath. 02/13/24  Yes Elnore Cosens R, NP  benzonatate (TESSALON) 100 MG capsule Take 1 capsule (100 mg total) by mouth every 8 (eight) hours. 02/13/24  Yes Cataleyah Colborn R, NP  predniSONE (STERAPRED  UNI-PAK 21 TAB) 10 MG (21) TBPK tablet Take by mouth daily. Take 6 tabs by mouth daily  for 1 days, then 5 tabs for 1 days, then 4 tabs for 1 days, then 3 tabs for 1 days, 2 tabs for 1 days, then 1 tab by mouth daily for 1 days 02/13/24  Yes Nakeitha Milligan R, NP  promethazine -dextromethorphan (PROMETHAZINE -DM) 6.25-15 MG/5ML syrup Take 5 mLs by mouth at bedtime as needed. 02/13/24  Yes Chai Verdejo, Maybelle Spatz, NP  Clascoterone  (WINLEVI ) 1 % CREA Apply to face twice a day to face 11/14/23   Artemio Larry, MD  clindamycin  (CLEOCIN  T) 1 % external solution Apply topically 2 (two) times daily. 05/30/23   Phylliss Brenner, CNM  clindamycin  (CLEOCIN -T) 1 % lotion Apply topically daily. qd to face, back and chest for acne 11/14/23 11/13/24  Artemio Larry, MD  fluconazole  (DIFLUCAN ) 150 MG tablet Take 1 tablet (150 mg total) by mouth every three (3) days as needed for up to 2 doses. 02/09/24   Kayana Thoen, Maybelle Spatz, NP  metroNIDAZOLE  (FLAGYL ) 500 MG tablet Take 1 tablet (500 mg total) by mouth 2 (two) times daily for 7 days. 02/13/24 02/20/24  Banister, Pamela K, MD  norethindrone-ethinyl estradiol (ORTHO-NOVUM  7/7/7, 28,) 0.5/0.75/1-35 MG-MCG tablet Take 1 tablet by mouth daily. 08/09/17   Darl Edu, MD  omeprazole (PRILOSEC) 20 MG capsule Take by mouth. 11/17/18 11/17/19  [provider]  promethazine  (PHENERGAN ) 25 MG tablet Take 1 tablet by mouth up to every 6 hours as needed for nausea/mild or moderate discomfort.  Do not combine with anxiety med. Patient not taking: Reported on 12/30/2023 11/17/18   [provider]  sertraline  (ZOLOFT ) 50 MG tablet Take 1 tablet (50 mg total) by mouth daily with breakfast. Patient not taking: Reported on 12/30/2023 04/30/19   Eappen, Saramma, MD  spironolactone  (ALDACTONE ) 100 MG tablet Take 1 tablet (100 mg total) by mouth daily. 11/14/23   Artemio Larry, MD  tretinoin  (RETIN-A ) 0.05 % cream Apply topically at bedtime. qhs to back, chest and shoulders for acne 11/14/23 11/13/24   Artemio Larry, MD  valACYclovir  (VALTREX ) 1000 MG tablet Take 1 tablet (1,000 mg total) by mouth daily for 5 days. 02/09/24 02/14/24  Reena Canning, NP  venlafaxine XR (EFFEXOR-XR) 37.5 MG 24 hr capsule Take 37.5 mg by mouth. 12/19/23 12/18/24  [provider]    Family History Family History  Problem Relation Age of Onset   Kidney cancer Maternal Uncle    Bipolar disorder Maternal Uncle    Alcohol abuse Maternal Uncle    Breast cancer Other    Bipolar disorder Mother    Anxiety disorder Father    Bipolar disorder Sister    Bipolar disorder Maternal Grandfather     Social History Social History   Tobacco Use   Smoking status: Never   Smokeless tobacco: Never  Vaping Use   Vaping status: Never Used  Substance Use Topics   Alcohol use: No   Drug use: No     Allergies   Omnicef [cefdinir]   Review of Systems Review of Systems  Respiratory:  Positive for cough.      Physical Exam Triage Vital Signs ED Triage Vitals  Encounter Vitals Group     BP 02/13/24 1910 103/69     Systolic BP Percentile --      Diastolic BP Percentile --      Pulse Rate 02/13/24 1910 (!) 105     Resp 02/13/24 1910 20     Temp 02/13/24 1910 99.3 F (37.4 C)     Temp Source 02/13/24 1910 Oral     SpO2 02/13/24 1910 97 %     Weight 02/13/24 1908 120 lb (54.4 kg)     Height 02/13/24 1908 5\' 1"  (1.549 m)     Head Circumference --      Peak Flow --      Pain Score 02/13/24 1907 9     Pain Loc --      Pain Education --      Exclude from Growth Chart --    No data found.  Updated Vital Signs BP 103/69 (BP Location: Left Arm)   Pulse (!) 105   Temp 99.3 F (37.4 C) (Oral)   Resp 20   Ht 5\' 1"  (1.549 m)   Wt 120 lb (54.4 kg)   LMP 02/02/2024 (Approximate)   SpO2 97%   BMI 22.67 kg/m   Visual Acuity Right Eye Distance:   Left Eye Distance:   Bilateral Distance:    Right Eye Near:   Left Eye Near:    Bilateral Near:     Physical Exam Constitutional:       Appearance: She is ill-appearing.  HENT:  Head: Normocephalic.     Right Ear: Tympanic membrane, ear canal and external ear normal.     Left Ear: Tympanic membrane, ear canal and external ear normal.     Nose: Congestion present.     Mouth/Throat:     Mouth: Mucous membranes are moist.     Pharynx: Oropharynx is clear.  Cardiovascular:     Rate and Rhythm: Normal rate and regular rhythm.     Pulses: Normal pulses.     Heart sounds: Normal heart sounds.  Pulmonary:     Effort: Pulmonary effort is normal.     Breath sounds: Normal breath sounds.  Musculoskeletal:     Cervical back: Normal range of motion and neck supple.  Neurological:     Mental Status: She is alert and oriented to person, place, and time. Mental status is at baseline.      UC Treatments / Results  Labs (all labs ordered are listed, but only abnormal results are displayed) Labs Reviewed - No data to display  EKG   Radiology No results found.  Procedures Procedures (including critical care time)  Medications Ordered in UC Medications - No data to display  Initial Impression / Assessment and Plan / UC Course  I have reviewed the triage vital signs and the nursing notes.  Pertinent labs & imaging results that were available during my care of the patient were reviewed by me and considered in my medical decision making (see chart for details).  COVID-19, mild intermittent asthma with acute exacerbation  Patient is in no signs of distress nor toxic appearing.  Vital signs are stable.  Low suspicion for pneumonia, pneumothorax or bronchitis and therefore will defer imaging.  Prescribed prednisone, Tessalon and Promethazine  DM.  Refilled inhaler.May use additional over-the-counter medications as needed for supportive care.  May follow-up with urgent care as needed if symptoms persist or worsen.     Final Clinical Impressions(s) / UC Diagnoses   Final diagnoses:  COVID-19  Mild intermittent asthma with  (acute) exacerbation   Discharge Instructions      Your symptoms today are most likely being caused by a virus and should steadily improve in time it can take up to 7 to 10 days before you truly start to see a turnaround however things will get better  Tomorrow take prednisone every morning with food as directed to open and relax the airway, will help with shortness of breath and wheezing  You may use your inhaler taking 2 puffs every 4 hours as needed for shortness of breath and wheezing, can buy multiple videos on social media and various website showing you how to use it  You may use Tessalon pill every 8 hours as needed for cough, may use cough syrup at bedtime to help you sleep    You can take Tylenol  and/or Ibuprofen as needed for fever reduction and pain relief.   For cough: honey 1/2 to 1 teaspoon (you can dilute the honey in water or another fluid).  You can also use guaifenesin and dextromethorphan for cough. You can use a humidifier for chest congestion and cough.  If you don't have a humidifier, you can sit in the bathroom with the hot shower running.      For sore throat: try warm salt water gargles, cepacol lozenges, throat spray, warm tea or water with lemon/honey, popsicles or ice, or OTC cold relief medicine for throat discomfort.   For congestion: take a daily anti-histamine like Zyrtec, Claritin, and a oral  decongestant, such as pseudoephedrine.  You can also use Flonase 1-2 sprays in each nostril daily.   It is important to stay hydrated: drink plenty of fluids (water, gatorade/powerade/pedialyte, juices, or teas) to keep your throat moisturized and help further relieve irritation/discomfort.   ED Prescriptions     Medication Sig Dispense Auth. Provider   promethazine -dextromethorphan (PROMETHAZINE -DM) 6.25-15 MG/5ML syrup Take 5 mLs by mouth at bedtime as needed. 118 mL Akacia Boltz R, NP   predniSONE (STERAPRED UNI-PAK 21 TAB) 10 MG (21) TBPK tablet Take by mouth  daily. Take 6 tabs by mouth daily  for 1 days, then 5 tabs for 1 days, then 4 tabs for 1 days, then 3 tabs for 1 days, 2 tabs for 1 days, then 1 tab by mouth daily for 1 days 21 tablet Sherlonda Flater R, NP   benzonatate (TESSALON) 100 MG capsule Take 1 capsule (100 mg total) by mouth every 8 (eight) hours. 21 capsule Kelcey Korus R, NP   albuterol (VENTOLIN HFA) 108 (90 Base) MCG/ACT inhaler Inhale 2 puffs into the lungs every 4 (four) hours as needed for wheezing or shortness of breath. 8 g Reena Canning, NP      PDMP not reviewed this encounter.   Reena Canning, [place] 02/14/24 [phone]

## 2024-03-01 DIAGNOSIS — R8290 Unspecified abnormal findings in urine: Secondary | ICD-10-CM | POA: Diagnosis not present

## 2024-03-01 DIAGNOSIS — R221 Localized swelling, mass and lump, neck: Secondary | ICD-10-CM | POA: Diagnosis not present

## 2024-03-01 DIAGNOSIS — Z8261 Family history of arthritis: Secondary | ICD-10-CM | POA: Diagnosis not present

## 2024-03-01 DIAGNOSIS — M2550 Pain in unspecified joint: Secondary | ICD-10-CM | POA: Diagnosis not present

## 2024-03-02 DIAGNOSIS — R8290 Unspecified abnormal findings in urine: Secondary | ICD-10-CM | POA: Diagnosis not present

## 2024-03-06 DIAGNOSIS — R221 Localized swelling, mass and lump, neck: Secondary | ICD-10-CM | POA: Diagnosis not present

## 2024-03-14 DIAGNOSIS — F331 Major depressive disorder, recurrent, moderate: Secondary | ICD-10-CM | POA: Diagnosis not present

## 2024-03-14 DIAGNOSIS — F4310 Post-traumatic stress disorder, unspecified: Secondary | ICD-10-CM | POA: Diagnosis not present

## 2024-03-14 DIAGNOSIS — F411 Generalized anxiety disorder: Secondary | ICD-10-CM | POA: Diagnosis not present

## 2024-03-14 DIAGNOSIS — M2550 Pain in unspecified joint: Secondary | ICD-10-CM | POA: Diagnosis not present

## 2024-03-30 ENCOUNTER — Telehealth: Admitting: Physician Assistant

## 2024-03-30 DIAGNOSIS — B351 Tinea unguium: Secondary | ICD-10-CM

## 2024-03-30 MED ORDER — EFINACONAZOLE 10 % EX SOLN: 1.0000 | Freq: Every day | CUTANEOUS | 0 refills | Status: AC

## 2024-03-30 NOTE — Progress Notes (Signed)
 Virtual Visit Consent   Shelly Knight, you are scheduled for a virtual visit with a [facility] provider today. Just as with appointments in the office, your consent must be obtained to participate. Your consent will be active for this visit and any virtual visit you may have with one of our providers in the next 365 days. If you have a MyChart account, a copy of this consent can be sent to you electronically.  As this is a virtual visit, video technology does not allow for your provider to perform a traditional examination. This may limit your provider's ability to fully assess your condition. If your provider identifies any concerns that need to be evaluated in person or the need to arrange testing (such as labs, EKG, etc.), we will make arrangements to do so. Although advances in technology are sophisticated, we cannot ensure that it will always work on either your end or our end. If the connection with a video visit is poor, the visit may have to be switched to a telephone visit. With either a video or telephone visit, we are not always able to ensure that we have a secure connection.  By engaging in this virtual visit, you consent to the provision of healthcare and authorize for your insurance to be billed (if applicable) for the services provided during this visit. Depending on your insurance coverage, you may receive a charge related to this service.  I need to obtain your verbal consent now. Are you willing to proceed with your visit today? Shelly Knight has provided verbal consent on 03/30/2024 for a virtual visit (video or telephone). Shelly Knight, [place]  Date: 03/30/2024 5:14 PM   Virtual Visit via Video Note   I, Shelly Knight, connected with  Shelly Knight  ([id number][birth year]) on 03/30/24 at  5:15 PM EDT by a video-enabled telemedicine application and verified that I am speaking with the correct person using two identifiers.  Location: Patient: Virtual Visit Location  Patient: Home Provider: Virtual Visit Location Provider: Home Office   I discussed the limitations of evaluation and management by telemedicine and the availability of in person appointments. The patient expressed understanding and agreed to proceed.    History of Present Illness: Shelly Knight is a 23 y.o. who identifies as a female who was assigned female at birth, and is being seen today for toenail issue.  HPI: Toe Pain  The incident occurred more than 1 week ago. The incident occurred at home. There was no injury mechanism. The pain is present in the left toes. The patient is experiencing no pain. Pertinent negatives include no inability to bear weight, loss of motion, loss of sensation, muscle weakness, numbness or tingling. Nothing aggravates the symptoms. She has tried nothing for the symptoms. The treatment provided no relief.    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   Noncompliance with medication regimen 04/30/2019    Allergies:  Allergies  Allergen Reactions   Omnicef [Cefdinir] Rash   Medications:  Current Outpatient Medications:    Efinaconazole 10 % SOLN, Apply 1 Application topically daily., Disp: 8 mL, Rfl: 0   albuterol  (VENTOLIN  HFA) 108 (90 Base) MCG/ACT inhaler, Inhale 2 puffs into the lungs every 4 (four) hours as needed for wheezing or shortness of breath., Disp: 8 g,  Rfl: 0   benzonatate  (TESSALON ) 100 MG capsule, Take 1 capsule (100 mg total) by mouth every 8 (eight) hours., Disp: 21 capsule, Rfl: 0   Clascoterone  (WINLEVI ) 1 % CREA, Apply to face twice a day to face, Disp: 60 g, Rfl: 3   clindamycin  (CLEOCIN  T) 1 % external solution, Apply topically 2 (two) times daily., Disp: 30 mL, Rfl: 0   clindamycin  (CLEOCIN -T) 1 %  lotion, Apply topically daily. qd to face, back and chest for acne, Disp: 60 mL, Rfl: 3   fluconazole  (DIFLUCAN ) 150 MG tablet, Take 1 tablet (150 mg total) by mouth every three (3) days as needed for up to 2 doses., Disp: 2 tablet, Rfl: 0   norethindrone-ethinyl estradiol (ORTHO-NOVUM 7/7/7, 28,) 0.5/0.75/1-35 MG-MCG tablet, Take 1 tablet by mouth daily., Disp: 3 Package, Rfl: 3   omeprazole (PRILOSEC) 20 MG capsule, Take by mouth., Disp: , Rfl:    predniSONE  (STERAPRED UNI-PAK 21 TAB) 10 MG (21) TBPK tablet, Take by mouth daily. Take 6 tabs by mouth daily  for 1 days, then 5 tabs for 1 days, then 4 tabs for 1 days, then 3 tabs for 1 days, 2 tabs for 1 days, then 1 tab by mouth daily for 1 days, Disp: 21 tablet, Rfl: 0   promethazine  (PHENERGAN ) 25 MG tablet, Take 1 tablet by mouth up to every 6 hours as needed for nausea/mild or moderate discomfort.  Do not combine with anxiety med. (Patient not taking: Reported on 12/30/2023), Disp: , Rfl:    promethazine -dextromethorphan (PROMETHAZINE -DM) 6.25-15 MG/5ML syrup, Take 5 mLs by mouth at bedtime as needed., Disp: 118 mL, Rfl: 0   sertraline  (ZOLOFT ) 50 MG tablet, Take 1 tablet (50 mg total) by mouth daily with breakfast. (Patient not taking: Reported on 12/30/2023), Disp: 30 tablet, Rfl: 0   spironolactone  (ALDACTONE ) 100 MG tablet, Take 1 tablet (100 mg total) by mouth daily., Disp: 30 tablet, Rfl: 5   tretinoin  (RETIN-A ) 0.05 % cream, Apply topically at bedtime. qhs to back, chest and shoulders for acne, Disp: 45 g, Rfl: 2   venlafaxine XR (EFFEXOR-XR) 37.5 MG 24 hr capsule, Take 37.5 mg by mouth., Disp: , Rfl:   Observations/Objective: Patient is well-developed, well-nourished in no acute distress.  Resting comfortably  at home.  Head is normocephalic, atraumatic.  No labored breathing.  Speech is clear and coherent with logical content.  Patient is alert and oriented at baseline.  Obvious discoloration of nail at great toe  Assessment and  Plan: 1. Onychomycosis (Primary)  Patient presenting with what she describes as an ongoing fungal infection for 1 year. Antifungal prescribed but highly recommended patient follow up with podiatry for ongoing care/treatment options. No acute or emergent conditions identified. All questions answered to the patients satisfaction   Follow Up Instructions: I discussed the assessment and treatment plan with the patient. The patient was provided an opportunity to ask questions and all were answered. The patient agreed with the plan and demonstrated an understanding of the instructions.  A copy of instructions were sent to the patient via MyChart unless otherwise noted below.    The patient was advised to call back or seek an in-person evaluation if the symptoms worsen or if the condition fails to improve as anticipated.    Shelly Shuck, PA-C

## 2024-03-30 NOTE — Patient Instructions (Signed)
 Lucie Portal, thank you for joining Teena Shuck, PA-C for today's virtual visit.  While this provider is not your primary care provider (PCP), if your PCP is located in our provider database this encounter information will be shared with them immediately following your visit.   A [facility] MyChart account gives you access to today's visit and all your visits, tests, and labs performed at [facility]  click here if you don't have a [facility] MyChart account or go to [email or web address]  Consent: (Patient) Shelly Knight provided verbal consent for this virtual visit at the beginning of the encounter.  Current Medications:  Current Outpatient Medications:    Efinaconazole 10 % SOLN, Apply 1 Application topically daily., Disp: 8 mL, Rfl: 0   albuterol  (VENTOLIN  HFA) 108 (90 Base) MCG/ACT inhaler, Inhale 2 puffs into the lungs every 4 (four) hours as needed for wheezing or shortness of breath., Disp: 8 g, Rfl: 0   benzonatate  (TESSALON ) 100 MG capsule, Take 1 capsule (100 mg total) by mouth every 8 (eight) hours., Disp: 21 capsule, Rfl: 0   Clascoterone  (WINLEVI ) 1 % CREA, Apply to face twice a day to face, Disp: 60 g, Rfl: 3   clindamycin  (CLEOCIN  T) 1 % external solution, Apply topically 2 (two) times daily., Disp: 30 mL, Rfl: 0   clindamycin  (CLEOCIN -T) 1 % lotion, Apply topically daily. qd to face, back and chest for acne, Disp: 60 mL, Rfl: 3   fluconazole  (DIFLUCAN ) 150 MG tablet, Take 1 tablet (150 mg total) by mouth every three (3) days as needed for up to 2 doses., Disp: 2 tablet, Rfl: 0   norethindrone-ethinyl estradiol (ORTHO-NOVUM 7/7/7, 28,) 0.5/0.75/1-35 MG-MCG tablet, Take 1 tablet by mouth daily., Disp: 3 Package, Rfl: 3   omeprazole (PRILOSEC) 20 MG capsule, Take by mouth., Disp: , Rfl:    predniSONE  (STERAPRED UNI-PAK 21 TAB) 10 MG (21) TBPK tablet, Take by mouth daily. Take 6 tabs by mouth daily  for 1 days, then 5 tabs for 1 days, then 4 tabs for  1 days, then 3 tabs for 1 days, 2 tabs for 1 days, then 1 tab by mouth daily for 1 days, Disp: 21 tablet, Rfl: 0   promethazine  (PHENERGAN ) 25 MG tablet, Take 1 tablet by mouth up to every 6 hours as needed for nausea/mild or moderate discomfort.  Do not combine with anxiety med. (Patient not taking: Reported on 12/30/2023), Disp: , Rfl:    promethazine -dextromethorphan (PROMETHAZINE -DM) 6.25-15 MG/5ML syrup, Take 5 mLs by mouth at bedtime as needed., Disp: 118 mL, Rfl: 0   sertraline  (ZOLOFT ) 50 MG tablet, Take 1 tablet (50 mg total) by mouth daily with breakfast. (Patient not taking: Reported on 12/30/2023), Disp: 30 tablet, Rfl: 0   spironolactone  (ALDACTONE ) 100 MG tablet, Take 1 tablet (100 mg total) by mouth daily., Disp: 30 tablet, Rfl: 5   tretinoin  (RETIN-A ) 0.05 % cream, Apply topically at bedtime. qhs to back, chest and shoulders for acne, Disp: 45 g, Rfl: 2   venlafaxine XR (EFFEXOR-XR) 37.5 MG 24 hr capsule, Take 37.5 mg by mouth., Disp: , Rfl:    Medications ordered in this encounter:  Meds ordered this encounter  Medications   Efinaconazole 10 % SOLN    Sig: Apply 1 Application topically daily.    Dispense:  8 mL    Refill:  0    Supervising Provider:   BLAISE ALEENE KIDD [id number]     *If you need refills on other medications prior to your next  appointment, please contact your pharmacy*  Follow-Up: Call back or seek an in-person evaluation if the symptoms worsen or if the condition fails to improve as anticipated.  [facility] [phone]  Other Instructions Please report to the nearest Emergency room with any worsening symptoms. Follow up with primary care provider (PCP) in 2 -3 days.    If you have been instructed to have an in-person evaluation today at a local Urgent Care facility, please use the link below. It will take you to a list of all of our available [facility], including address, phone number and hours of operation. Please do not  delay care.  [facility]  If you or a family member do not have a primary care provider, use the link below to schedule a visit and establish care. When you choose a [facility] primary care physician or advanced practice provider, you gain a long-term partner in health. Find a Primary Care Provider  Learn more about [facility]'s in-office and virtual care options: [facility] - Get Care Now

## 2024-04-09 ENCOUNTER — Ambulatory Visit: Admitting: Podiatry

## 2024-04-20 ENCOUNTER — Other Ambulatory Visit: Payer: Self-pay

## 2024-04-20 DIAGNOSIS — F411 Generalized anxiety disorder: Secondary | ICD-10-CM | POA: Diagnosis not present

## 2024-04-20 DIAGNOSIS — F331 Major depressive disorder, recurrent, moderate: Secondary | ICD-10-CM | POA: Diagnosis not present

## 2024-04-20 DIAGNOSIS — F4310 Post-traumatic stress disorder, unspecified: Secondary | ICD-10-CM | POA: Diagnosis not present

## 2024-04-20 MED ORDER — LAMOTRIGINE 25 MG PO TABS
50.0000 mg | ORAL_TABLET | Freq: Two times a day (BID) | ORAL | 1 refills | Status: DC
  Filled 2024-04-20: qty 120, 30d supply, fill #0

## 2024-04-20 MED ORDER — QUETIAPINE FUMARATE 50 MG PO TABS
50.0000 mg | ORAL_TABLET | Freq: Every day | ORAL | 1 refills | Status: AC
  Filled 2024-04-20 (×2): qty 30, 30d supply, fill #0

## 2024-04-20 MED ORDER — LAMOTRIGINE 25 MG PO TABS
50.0000 mg | ORAL_TABLET | Freq: Two times a day (BID) | ORAL | 1 refills | Status: AC
  Filled 2024-04-20: qty 120, 30d supply, fill #0

## 2024-05-03 ENCOUNTER — Ambulatory Visit
Admission: EM | Admit: 2024-05-03 | Discharge: 2024-05-03 | Disposition: A | Discharge status: Home / Self Care | Attending: Emergency Medicine | Admitting: Emergency Medicine

## 2024-05-03 ENCOUNTER — Encounter: Payer: Self-pay | Admitting: Emergency Medicine

## 2024-05-03 DIAGNOSIS — N898 Other specified noninflammatory disorders of vagina: Secondary | ICD-10-CM | POA: Insufficient documentation

## 2024-05-03 MED ORDER — FLUCONAZOLE 150 MG PO TABS: 150.0000 mg | ORAL_TABLET | ORAL | 0 refills | Status: AC

## 2024-05-03 MED ORDER — METRONIDAZOLE 500 MG PO TABS: 500.0000 mg | ORAL_TABLET | Freq: Two times a day (BID) | ORAL | 0 refills | Status: DC

## 2024-05-03 NOTE — Discharge Instructions (Addendum)
 Today you are being treated for  Bacterial vaginosis and yeast  Take Metronidazole  500 mg twice a day for 7 days, do not drink alcohol while using medication, this will make you feel sick   Bacterial vaginosis which results from an overgrowth of one on several organisms that are normally present in your vagina. Vaginosis is an inflammation of the vagina that can result in discharge, itching and pain.  Yeast vaginitis occurs when the pH is altered allowing fungus to form which can cause irritation and itching and discharge  Labs pending 2-3 days, you will be contacted if positive for any sti and treatment will be sent to the pharmacy, you will have to return to the clinic if positive for gonorrhea to receive treatment   Moving forward may attempt use of over-the-counter boric acid, try to use on the first day that you begin to experience symptoms even if they are mild to prevent progression worsening of your symptoms  Please refrain from having sex until labs results, if positive please refrain from having sex until treatment complete and symptoms resolve   If positive for  Chlamydia  gonorrhea or trichomoniasis please notify partner or partners so they may tested as well  Moving forward, it is recommended you use some form of protection against the transmission of sti infections  such as condoms or dental dams with each sexual encounter     In addition: Avoid baths, hot tubs and whirlpool spas.  Don't use scented or harsh soaps Avoid irritants. These include scented tampons and pads. Wipe from front to back after using the toilet. Don't douche. Your vagina doesn't require cleansing other than normal bathing.  Use a condom.  Wear cotton underwear, this fabric absorbs some moisture.

## 2024-05-03 NOTE — ED Provider Notes (Signed)
 [facility]    CSN: [id number] Arrival date & time: 05/03/24  1741      History   Chief Complaint Chief Complaint  Patient presents with   Vaginal Itching    HPI Shelly Knight is a 23 y.o. female.   Patient presents for evaluation of Akiba Melfi thick vaginal discharge, itching and odor present for 7 days.  Associated intermittent lower abdominal pain.  Symptoms began after leaving tampon in for an extended period of time during menstruation.  Has attempted use of 1 dose of fluconazole  and Vagisil which provided no relief.  Denies flank pain, urinary symptoms.  Sexually active but denies known exposure.  Past Medical History:  Diagnosis Date   Acne    Accutane  therapy. iPledge: [phone]   Anxiety    Asthma    Depression     Patient Active Problem List   Diagnosis Date Noted   Cellulitis 05/30/2023   Acne vulgaris 11/23/2019   Generalized anxiety disorder 04/30/2019   Major depressive disorder, single episode, moderate (HCC) 04/30/2019   Insomnia due to mental condition 04/30/2019   Noncompliance with medication regimen 04/30/2019    History reviewed. No pertinent surgical history.  OB History     Gravida  1   Para      Term      Preterm      AB      Living         SAB      IAB      Ectopic      Multiple      Live Births               Home Medications    Prior to Admission medications   Medication Sig Start Date End Date Taking? Authorizing Provider  fluconazole  (DIFLUCAN ) 150 MG tablet Take 1 tablet (150 mg total) by mouth once a week for 2 doses. 05/03/24 05/11/24 Yes Jalynn Betzold, Shelba SAUNDERS, NP  metroNIDAZOLE  (FLAGYL ) 500 MG tablet Take 1 tablet (500 mg total) by mouth 2 (two) times daily. 05/03/24  Yes Diem Dicocco, Shelba SAUNDERS, NP  albuterol  (VENTOLIN  HFA) 108 (90 Base) MCG/ACT inhaler Inhale 2 puffs into the lungs every 4 (four) hours as needed for wheezing or shortness of breath. 02/13/24   Odysseus Cada, Shelba SAUNDERS, NP  benzonatate  (TESSALON )  100 MG capsule Take 1 capsule (100 mg total) by mouth every 8 (eight) hours. 02/13/24   Teresa Shelba SAUNDERS, NP  Clascoterone  (WINLEVI ) 1 % CREA Apply to face twice a day to face 11/14/23   Jackquline Sawyer, MD  clindamycin  (CLEOCIN  T) 1 % external solution Apply topically 2 (two) times daily. 05/30/23   Justino Eleanor HERO, CNM  clindamycin  (CLEOCIN -T) 1 % lotion Apply topically daily. qd to face, back and chest for acne 11/14/23 11/13/24  Jackquline Sawyer, MD  fluconazole  (DIFLUCAN ) 150 MG tablet Take 1 tablet (150 mg total) by mouth every three (3) days as needed for up to 2 doses. 02/09/24   Kathee Tumlin, Shelba SAUNDERS, NP  lamoTRIgine  (LAMICTAL ) 25 MG tablet Take 2 tablets (50 mg total) by mouth every 12 (twelve) hours. 04/20/24   [name], MD  norethindrone-ethinyl estradiol (ORTHO-NOVUM 7/7/7, 28,) 0.5/0.75/1-35 MG-MCG tablet Take 1 tablet by mouth daily. 08/09/17   Lake Read, MD  omeprazole (PRILOSEC) 20 MG capsule Take by mouth. 11/17/18 11/17/19  [provider]  predniSONE  (STERAPRED UNI-PAK 21 TAB) 10 MG (21) TBPK tablet Take by mouth daily. Take 6 tabs by mouth daily  for 1  days, then 5 tabs for 1 days, then 4 tabs for 1 days, then 3 tabs for 1 days, 2 tabs for 1 days, then 1 tab by mouth daily for 1 days 02/13/24   Asako Saliba R, NP  promethazine  (PHENERGAN ) 25 MG tablet Take 1 tablet by mouth up to every 6 hours as needed for nausea/mild or moderate discomfort.  Do not combine with anxiety med. Patient not taking: Reported on 12/30/2023 11/17/18   [provider]  promethazine -dextromethorphan (PROMETHAZINE -DM) 6.25-15 MG/5ML syrup Take 5 mLs by mouth at bedtime as needed. 02/13/24   Lekesha Claw, Shelba SAUNDERS, NP  QUEtiapine  (SEROQUEL ) 50 MG tablet Take 1 tablet (50 mg total) by mouth at bedtime. 04/20/24   [name], MD  sertraline  (ZOLOFT ) 50 MG tablet Take 1 tablet (50 mg total) by mouth daily with breakfast. Patient not taking: Reported on 12/30/2023 04/30/19   Eappen, Saramma, MD   spironolactone  (ALDACTONE ) 100 MG tablet Take 1 tablet (100 mg total) by mouth daily. 11/14/23   Jackquline Sawyer, MD  tretinoin  (RETIN-A ) 0.05 % cream Apply topically at bedtime. qhs to back, chest and shoulders for acne 11/14/23 11/13/24  Stewart, Tara, MD  venlafaxine XR (EFFEXOR-XR) 37.5 MG 24 hr capsule Take 37.5 mg by mouth. 12/19/23 12/18/24  [provider]    Family History Family History  Problem Relation Age of Onset   Kidney cancer Maternal Uncle    Bipolar disorder Maternal Uncle    Alcohol abuse Maternal Uncle    Breast cancer Other    Bipolar disorder Mother    Anxiety disorder Father    Bipolar disorder Sister    Bipolar disorder Maternal Grandfather     Social History Social History   Tobacco Use   Smoking status: Never   Smokeless tobacco: Never  Vaping Use   Vaping status: Never Used  Substance Use Topics   Alcohol use: No   Drug use: No     Allergies   Omnicef [cefdinir]   Review of Systems Review of Systems   Physical Exam Triage Vital Signs ED Triage Vitals  Encounter Vitals Group     BP 05/03/24 1850 107/71     Girls Systolic BP Percentile --      Girls Diastolic BP Percentile --      Boys Systolic BP Percentile --      Boys Diastolic BP Percentile --      Pulse Rate 05/03/24 1850 77     Resp 05/03/24 1850 18     Temp 05/03/24 1850 98.7 F (37.1 C)     Temp Source 05/03/24 1850 Oral     SpO2 05/03/24 1850 97 %     Weight --      Height --      Head Circumference --      Peak Flow --      Pain Score 05/03/24 1849 6     Pain Loc --      Pain Education --      Exclude from Growth Chart --    No data found.  Updated Vital Signs BP 107/71 (BP Location: Left Arm)   Pulse 77   Temp 98.7 F (37.1 C) (Oral)   Resp 18   LMP  (LMP Unknown)   SpO2 97%   Visual Acuity Right Eye Distance:   Left Eye Distance:   Bilateral Distance:    Right Eye Near:   Left Eye Near:    Bilateral Near:     Physical Exam Constitutional:  Appearance: Normal appearance.  Eyes:     Extraocular Movements: Extraocular movements intact.  Pulmonary:     Effort: Pulmonary effort is normal.  Abdominal:     Tenderness: There is no abdominal tenderness. There is no right CVA tenderness, left CVA tenderness or guarding.  Genitourinary:    Comments: deferred Neurological:     Mental Status: She is alert and oriented to person, place, and time. Mental status is at baseline.      UC Treatments / Results  Labs (all labs ordered are listed, but only abnormal results are displayed) Labs Reviewed  CERVICOVAGINAL ANCILLARY ONLY    EKG   Radiology No results found.  Procedures Procedures (including critical care time)  Medications Ordered in UC Medications - No data to display  Initial Impression / Assessment and Plan / UC Course  I have reviewed the triage vital signs and the nursing notes.  Pertinent labs & imaging results that were available during my care of the patient were reviewed by me and considered in my medical decision making (see chart for details).  Vaginal discharge  Treating empirically for BV and yeast, metronidazole  prescribed and Diflucan , discussed administration, STI labs pending will treat additionally per protocol, advised abstinence until lab results, treatment is complete and all symptoms have resolved, advised abstaining from alcohol during treatment of metronidazole , may follow-up with urgent care as needed, given information to gynecology Final Clinical Impressions(s) / UC Diagnoses   Final diagnoses:  Vaginal discharge     Discharge Instructions      Today you are being treated for  Bacterial vaginosis and yeast  Take Metronidazole  500 mg twice a day for 7 days, do not drink alcohol while using medication, this will make you feel sick   Bacterial vaginosis which results from an overgrowth of one on several organisms that are normally present in your vagina. Vaginosis is an inflammation  of the vagina that can result in discharge, itching and pain.  Yeast vaginitis occurs when the pH is altered allowing fungus to form which can cause irritation and itching and discharge  Labs pending 2-3 days, you will be contacted if positive for any sti and treatment will be sent to the pharmacy, you will have to return to the clinic if positive for gonorrhea to receive treatment   Moving forward may attempt use of over-the-counter boric acid, try to use on the first day that you begin to experience symptoms even if they are mild to prevent progression worsening of your symptoms  Please refrain from having sex until labs results, if positive please refrain from having sex until treatment complete and symptoms resolve   If positive for  Chlamydia  gonorrhea or trichomoniasis please notify partner or partners so they may tested as well  Moving forward, it is recommended you use some form of protection against the transmission of sti infections  such as condoms or dental dams with each sexual encounter     In addition: Avoid baths, hot tubs and whirlpool spas.  Don't use scented or harsh soaps Avoid irritants. These include scented tampons and pads. Wipe from front to back after using the toilet. Don't douche. Your vagina doesn't require cleansing other than normal bathing.  Use a condom.  Wear cotton underwear, this fabric absorbs some moisture.        ED Prescriptions     Medication Sig Dispense Auth. Provider   metroNIDAZOLE  (FLAGYL ) 500 MG tablet Take 1 tablet (500 mg total) by mouth 2 (two)  times daily. 14 tablet Jamarques Pinedo R, NP   fluconazole  (DIFLUCAN ) 150 MG tablet Take 1 tablet (150 mg total) by mouth once a week for 2 doses. 2 tablet Khye Hochstetler R, NP      PDMP not reviewed this encounter.   Teresa Shelba SAUNDERS, [place] 05/03/24 [phone]

## 2024-05-03 NOTE — ED Triage Notes (Signed)
 Patient reports vaginal itching, odor, pain and burning times 1 week. Patient took OTC medication unable to recall name. Rates pain 6/10.

## 2024-05-04 ENCOUNTER — Ambulatory Visit (HOSPITAL_COMMUNITY): Payer: Self-pay

## 2024-05-04 LAB — CERVICOVAGINAL ANCILLARY ONLY
Bacterial Vaginitis (gardnerella): POSITIVE — AB
Candida Glabrata: NEGATIVE
Candida Vaginitis: POSITIVE — AB
Chlamydia: NEGATIVE
Comment: NEGATIVE
Comment: NEGATIVE
Comment: NEGATIVE
Comment: NEGATIVE
Comment: NEGATIVE
Comment: NORMAL
Neisseria Gonorrhea: NEGATIVE
Trichomonas: NEGATIVE

## 2024-05-15 ENCOUNTER — Ambulatory Visit (INDEPENDENT_AMBULATORY_CARE_PROVIDER_SITE_OTHER): Admitting: Dermatology

## 2024-05-15 DIAGNOSIS — L739 Follicular disorder, unspecified: Secondary | ICD-10-CM | POA: Diagnosis not present

## 2024-05-15 DIAGNOSIS — L700 Acne vulgaris: Secondary | ICD-10-CM

## 2024-05-15 DIAGNOSIS — L813 Cafe au lait spots: Secondary | ICD-10-CM

## 2024-05-15 DIAGNOSIS — L814 Other melanin hyperpigmentation: Secondary | ICD-10-CM

## 2024-05-15 DIAGNOSIS — L578 Other skin changes due to chronic exposure to nonionizing radiation: Secondary | ICD-10-CM

## 2024-05-15 DIAGNOSIS — D225 Melanocytic nevi of trunk: Secondary | ICD-10-CM | POA: Diagnosis not present

## 2024-05-15 DIAGNOSIS — Z1283 Encounter for screening for malignant neoplasm of skin: Secondary | ICD-10-CM | POA: Diagnosis not present

## 2024-05-15 DIAGNOSIS — L858 Other specified epidermal thickening: Secondary | ICD-10-CM | POA: Diagnosis not present

## 2024-05-15 DIAGNOSIS — D229 Melanocytic nevi, unspecified: Secondary | ICD-10-CM

## 2024-05-15 DIAGNOSIS — W908XXA Exposure to other nonionizing radiation, initial encounter: Secondary | ICD-10-CM | POA: Diagnosis not present

## 2024-05-15 MED ORDER — DOXYCYCLINE HYCLATE 20 MG PO TABS: 40.0000 mg | ORAL_TABLET | Freq: Every day | ORAL | 6 refills | Status: AC

## 2024-05-15 MED ORDER — CLINDAMYCIN PHOSPHATE 1 % EX SOLN: CUTANEOUS | 6 refills | Status: AC

## 2024-05-15 MED ORDER — SPIRONOLACTONE 100 MG PO TABS: 100.0000 mg | ORAL_TABLET | Freq: Every day | ORAL | 6 refills | Status: AC

## 2024-05-15 MED ORDER — ADAPALENE 0.3 % EX GEL: CUTANEOUS | 6 refills | Status: AC

## 2024-05-15 NOTE — Progress Notes (Signed)
 Follow-Up Visit   Subjective  Shelly Knight is a 23 y.o. female who presents for the following: Skin Cancer Screening and Full Body Skin Exam hx of acne - using spironolactone  100 mg , clindamycin  lotion, panoxyl wash, not currently using doxycycline  100 mg.    The patient presents for Total-Body Skin Exam (TBSE) for skin cancer screening and mole check. The patient has spots, moles and lesions to be evaluated, some may be new or changing and the patient may have concern these could be cancer.    The following portions of the chart were reviewed this encounter and updated as appropriate: medications, allergies, medical history  Review of Systems:  No other skin or systemic complaints except as noted in HPI or Assessment and Plan.  Objective  Well appearing patient in no apparent distress; mood and affect are within normal limits.  A full examination was performed including scalp, head, eyes, ears, nose, lips, neck, chest, axillae, abdomen, back, buttocks, bilateral upper extremities, bilateral lower extremities, hands, feet, fingers, toes, fingernails, and toenails. All findings within normal limits unless otherwise noted below.   Relevant physical exam findings are noted in the Assessment and Plan.    Assessment & Plan   SKIN CANCER SCREENING PERFORMED TODAY.  ACTINIC DAMAGE - Chronic condition, secondary to cumulative UV/sun exposure - diffuse scaly erythematous macules with underlying dyspigmentation - Recommend daily broad spectrum sunscreen SPF 30+ to sun-exposed areas, reapply every 2 hours as needed.  - Staying in the shade or wearing long sleeves, sun glasses (UVA+UVB protection) and wide brim hats (4-inch brim around the entire circumference of the hat) are also recommended for sun protection.  - Call for new or changing lesions.  LENTIGINES Exam: scattered tan macules Due to sun exposure Treatment Plan: Benign-appearing, observe. Recommend daily broad spectrum  sunscreen SPF 30+ to sun-exposed areas, reapply every 2 hours as needed.  Call for any changes   MELANOCYTIC NEVI - 3 mm medium dark brown macule at left breast, no changes per pt - Tan-brown and/or pink-flesh-colored symmetric macules and papules - Benign appearing on exam today - Observation - Call clinic for new or changing moles - Recommend daily use of broad spectrum spf 30+ sunscreen to sun-exposed areas.   Benign-appearing.  Observation.  Call clinic for new or changing lesions.  Recommend daily use of broad spectrum spf 30+ sunscreen to sun-exposed areas.   Cafe au Lait at left medial buttock  - Tan patch - Genetic - Benign, observe - Call for any changes  KERATOSIS PILARIS At b/l arms  - Tiny follicular keratotic papules - Benign. Genetic in nature. No cure. - Observe. - If desired, patient can use an emollient (moisturizer) containing ammonium lactate (AmLactin), urea  or salicylic acid once a day to smooth the area  Recommend starting moisturizer with exfoliant (Urea , Salicylic acid, or Lactic acid) one to two times daily to help smooth rough and bumpy skin.  OTC options include Cetaphil Rough and Bumpy lotion (Urea ), Eucerin Roughness Relief lotion or spot treatment cream (Urea ), CeraVe SA lotion/cream for Rough and Bumpy skin (Sal Acid), Gold Bond Rough and Bumpy cream (Sal Acid), and AmLactin 12% lotion/cream (Lactic Acid).  If applying in morning, also apply sunscreen to sun-exposed areas, since these exfoliating moisturizers can increase sensitivity to sun.    ACNE VULGARIS Face, back, chest Exam : Inflammatory papules on upper back, inflamed comedones on cheeks, and closed comedones on forehead and chin Todays BP 119/81 P62   Chronic and persistent condition  with duration or expected duration over one year. Condition is improving with treatment but not currently at goal.     Treatment Plan: Start Adapalene  0.3 % gel - apply pea sized amount to face , back, chest at  bedtime every other night as tolerated, apply over moisturizer  Start doxycycline  20 mg tab - take 2 tabs (40 mg) po qd with food and drink  Continue spironolactone  100 mg  1 po qd  Continue Clindamycin  solution every day/bid to face, back, chest (patient prefers solution vs lotion)    Continue Panoxyl BP Wash qd       Topical retinoid medications like tretinoin /Retin-A , adapalene /Differin , tazarotene/Fabior, and Epiduo/Epiduo Forte can cause dryness and irritation when first started. Only apply a pea-sized amount to the entire affected area. Avoid applying it around the eyes, edges of mouth and creases at the nose. If you experience irritation, use a good moisturizer first and/or apply the medicine less often. If you are doing well with the medicine, you can increase how often you use it until you are applying every night. Be careful with sun protection while using this medication as it can make you sensitive to the sun. This medicine should not be used by pregnant women.     Spironolactone  can cause increased urination and cause blood pressure to decrease. Please watch for signs of lightheadedness and be cautious when changing position. It can sometimes cause breast tenderness or an irregular period in premenopausal women. It can also increase potassium. The increase in potassium usually is not a concern unless you are taking other medicines that also increase potassium, so please be sure your doctor knows all of the other medications you are taking. This medication should not be taken by pregnant women.  This medicine should also not be taken together with sulfa  drugs like Bactrim  (trimethoprim /sulfamethexazole).    Doxycycline  should be taken with food to prevent nausea. Do not lay down for 30 minutes after taking. Be cautious with sun exposure and use good sun protection while on this medication. Pregnant women should not take this medication.    Benzoyl peroxide can cause dryness and  irritation of the skin. It can also bleach fabric. When used together with Aczone (dapsone) cream, it can stain the skin orange.   Long term medication management.  Patient is using long term (months to years) prescription medication  to control their dermatologic condition.  These medications require periodic monitoring to evaluate for efficacy and side effects and may require periodic laboratory monitoring.  FOLLICULITIS Exam: Clear today at legs, pus bumps come up after shaving legs  Chronic and persistent condition with duration or expected duration over one year. Condition is symptomatic / bothersome to patient. Not to goal.  Folliculitis occurs due to inflammation of the superficial hair follicle (pore), resulting in acne-like lesions (pus bumps). It can be infectious (bacterial, fungal) or noninfectious (shaving, tight clothing, heat/sweat, medications).  Folliculitis can be acute or chronic and recommended treatment depends on the underlying cause of folliculitis.  Treatment Plan: Can use otc Panoxyl BP wash when shaving   Recommend Clindamycin  1 % solution qd/bid after shaving and to razor bumps as needed  Avoid close shaving, may try electric shaver   ACNE VULGARIS   Related Medications Adapalene  (DIFFERIN ) 0.3 % gel Apply pea sized amount to face, back, chest qhs as tolerated doxycycline  (PERIOSTAT ) 20 MG tablet Take 2 tablets (40 mg total) by mouth daily. With food and drink for acne spironolactone  (ALDACTONE ) 100 MG tablet  Take 1 tablet (100 mg total) by mouth daily. FOLLICULITIS   Related Medications clindamycin  (CLEOCIN  T) 1 % external solution Apply topically qd/bid to face, chest, back for acne Return in about 6 months (around 11/12/2024) for acne follow up.  I, Eleanor Blush, CMA, am acting as scribe for Rexene Rattler, MD.   Documentation: I have reviewed the above documentation for accuracy and completeness, and I agree with the above.  Rexene Rattler,  MD

## 2024-05-15 NOTE — Patient Instructions (Addendum)
 For acne  Continue Panoxyl wash daily for acne at face, chest, back - can also use when showering to bumps at legs from shaving  Continue spironolactone  100 mg tab by mouth daily   Spironolactone  can cause increased urination and cause blood pressure to decrease. Please watch for signs of lightheadedness and be cautious when changing position. It can sometimes cause breast tenderness or an irregular period in premenopausal women. It can also increase potassium. The increase in potassium usually is not a concern unless you are taking other medicines that also increase potassium, so please be sure your doctor knows all of the other medications you are taking. This medication should not be taken by pregnant women.  This medicine should also not be taken together with sulfa  drugs like Bactrim  (trimethoprim /sulfamethexazole).    Continue clindamycin  1 % solution -  apply topically to face, chest, back once to twice daily for acne - can also use on razor bumps at areas where you shave    Start doxycycline  20 mg tab - take 2 tabs by mouth daily with food and drink.   Doxycycline  should be taken with food to prevent nausea. Do not lay down for 30 minutes after taking. Be cautious with sun exposure and use good sun protection while on this medication. Pregnant women should not take this medication.    Start adapalene  0.3 % gel apply pea sized amount to face and back for acne as tolerated. Apply on top of moisturizer, can use every other day as tolerated if dryness occurs  Topical retinoid medications like tretinoin /Retin-A , adapalene /Differin , tazarotene/Fabior, and Epiduo/Epiduo Forte can cause dryness and irritation when first started. Only apply a pea-sized amount to the entire affected area. Avoid applying it around the eyes, edges of mouth and creases at the nose. If you experience irritation, use a good moisturizer first and/or apply the medicine less often. If you are doing well with the medicine,  you can increase how often you use it until you are applying every night. Be careful with sun protection while using this medication as it can make you sensitive to the sun. This medicine should not be used by pregnant women.      Melanoma ABCDEs  Melanoma is the most dangerous type of skin cancer, and is the leading cause of death from skin disease.  You are more likely to develop melanoma if you: Have light-colored skin, light-colored eyes, or red or blond hair Spend a lot of time in the sun Tan regularly, either outdoors or in a tanning bed Have had blistering sunburns, especially during childhood Have a close family member who has had a melanoma Have atypical moles or large birthmarks  Early detection of melanoma is key since treatment is typically straightforward and cure rates are extremely high if we catch it early.   The first sign of melanoma is often a change in a mole or a new dark spot.  The ABCDE system is a way of remembering the signs of melanoma.  A for asymmetry:  The two halves do not match. B for border:  The edges of the growth are irregular. C for color:  A mixture of colors are present instead of an even brown color. D for diameter:  Melanomas are usually (but not always) greater than 6mm - the size of a pencil eraser. E for evolution:  The spot keeps changing in size, shape, and color.  Please check your skin once per month between visits. You can use a small  mirror in front and a large mirror behind you to keep an eye on the back side or your body.   If you see any new or changing lesions before your next follow-up, please call to schedule a visit.  Please continue daily skin protection including broad spectrum sunscreen SPF 30+ to sun-exposed areas, reapplying every 2 hours as needed when you're outdoors.   Staying in the shade or wearing long sleeves, sun glasses (UVA+UVB protection) and wide brim hats (4-inch brim around the entire circumference of the hat)  are also recommended for sun protection.    Due to recent changes in healthcare laws, you may see results of your pathology and/or laboratory studies on MyChart before the doctors have had a chance to review them. We understand that in some cases there may be results that are confusing or concerning to you. Please understand that not all results are received at the same time and often the doctors may need to interpret multiple results in order to provide you with the best plan of care or course of treatment. Therefore, we ask that you please give us  2 business days to thoroughly review all your results before contacting the office for clarification. Should we see a critical lab result, you will be contacted sooner.   If You Need Anything After Your Visit  If you have any questions or concerns for your doctor, please call our main line at [phone] and press option 4 to reach your doctor's medical assistant. If no one answers, please leave a voicemail as directed and we will return your call as soon as possible. Messages left after 4 pm will be answered the following business day.   You may also send us  a message via MyChart. We typically respond to MyChart messages within 1-2 business days.  For prescription refills, please ask your pharmacy to contact our office. Our fax number is [phone].  If you have an urgent issue when the clinic is closed that cannot wait until the next business day, you can page your doctor at the number below.    Please note that while we do our best to be available for urgent issues outside of office hours, we are not available 24/7.   If you have an urgent issue and are unable to reach us , you may choose to seek medical care at your doctor's office, retail clinic, urgent care center, or emergency room.  If you have a medical emergency, please immediately call 911 or go to the emergency department.  Pager Numbers  - Dr. Hester: [phone]  - Dr. Jackquline:  [phone]  - Dr. Claudene: [phone]   - Dr. Raymund: [phone]  In the event of inclement weather, please call our main line at [phone] for an update on the status of any delays or closures.  Dermatology Medication Tips: Please keep the boxes that topical medications come in in order to help keep track of the instructions about where and how to use these. Pharmacies typically print the medication instructions only on the boxes and not directly on the medication tubes.   If your medication is too expensive, please contact our office at [phone] option 4 or send us  a message through MyChart.   We are unable to tell what your co-pay for medications will be in advance as this is different depending on your insurance coverage. However, we may be able to find a substitute medication at lower cost or fill out paperwork to get insurance to cover a needed medication.  If a prior authorization is required to get your medication covered by your insurance company, please allow us  1-2 business days to complete this process.  Drug prices often vary depending on where the prescription is filled and some pharmacies may offer cheaper prices.  The website www.goodrx.com contains coupons for medications through different pharmacies. The prices here do not account for what the cost may be with help from insurance (it may be cheaper with your insurance), but the website can give you the price if you did not use any insurance.  - You can print the associated coupon and take it with your prescription to the pharmacy.  - You may also stop by our office during regular business hours and pick up a GoodRx coupon card.  - If you need your prescription sent electronically to a different pharmacy, notify our office through [facility] or by phone at [phone] option 4.     Si Usted Necesita Algo Despus de Su Visita  Tambin puede enviarnos un mensaje a travs de Clinical cytogeneticist. Por lo general  respondemos a los mensajes de MyChart en el transcurso de 1 a 2 das hbiles.  Para renovar recetas, por favor pida a su farmacia que se ponga en contacto con nuestra oficina. Randi lakes de fax es [place] [phone].  Si tiene un asunto urgente cuando la clnica est cerrada y que no puede esperar hasta el siguiente da hbil, puede llamar/localizar a su doctor(a) al nmero que aparece a continuacin.   Por favor, tenga en cuenta que aunque hacemos todo lo posible para estar disponibles para asuntos urgentes fuera del horario de [place], no estamos disponibles las 24 horas del da, los 7 [address]  [address] de la [place].   Si tiene un problema urgente y no puede comunicarse con nosotros, puede optar por buscar atencin mdica  en el consultorio de su doctor(a), en una clnica privada, en un centro de atencin urgente o en una sala de emergencias.  Si tiene Engineer, drilling, por favor llame inmediatamente al 911 o vaya a la sala de emergencias.  Nmeros de bper  - Dr. Hester: [phone]  - Dra. Jackquline: [phone]  - Dr. Claudene: [phone]  - Dra. Kitts: [phone]  En caso de inclemencias del [place], por favor llame a nuestra lnea principal al [phone] para una actualizacin sobre el estado de cualquier retraso o cierre.  Consejos para la medicacin en dermatologa: Por favor, guarde las cajas en las que vienen los medicamentos de uso tpico para ayudarle a seguir las instrucciones sobre dnde y cmo usarlos. Las farmacias generalmente imprimen las instrucciones del medicamento slo en las cajas y no directamente en los tubos del [place].   Si su medicamento es muy caro, por favor, pngase en contacto con landry rieger llamando al [phone] y presione la opcin 4 o envenos un mensaje a travs de Clinical cytogeneticist.   No podemos decirle cul ser su copago por los medicamentos por adelantado ya que esto es diferente dependiendo de la cobertura de su seguro. Sin embargo, es posible  que podamos encontrar un medicamento sustituto a Audiological scientist un formulario para que el seguro cubra el medicamento que se considera necesario.   Si se requiere una autorizacin previa para que su compaa de seguros malta su medicamento, por favor permtanos de 1 a 2 das hbiles para completar este proceso.  Los precios de los medicamentos varan con frecuencia dependiendo del Environmental consultant de dnde se surte la receta y alguna farmacias pueden ofrecer precios ms baratos.  El sitio web www.goodrx.com tiene cupones para medicamentos de Health and safety inspector. Los precios aqu no tienen en cuenta lo que podra costar con la ayuda del seguro (puede ser ms barato con su seguro), pero el sitio web puede darle el precio si no utiliz Tourist information centre manager.  - Puede imprimir el cupn correspondiente y llevarlo con su receta a la farmacia.  - Tambin puede pasar por nuestra oficina durante el horario de atencin regular y Education officer, museum una tarjeta de cupones de GoodRx.  - Si necesita que su receta se enve electrnicamente a una farmacia diferente, informe a nuestra oficina a travs de MyChart de [place] o por telfono llamando al [phone] y presione la opcin 4.

## 2024-05-23 DIAGNOSIS — F411 Generalized anxiety disorder: Secondary | ICD-10-CM | POA: Diagnosis not present

## 2024-05-23 DIAGNOSIS — F5105 Insomnia due to other mental disorder: Secondary | ICD-10-CM | POA: Diagnosis not present

## 2024-05-23 DIAGNOSIS — F323 Major depressive disorder, single episode, severe with psychotic features: Secondary | ICD-10-CM | POA: Diagnosis not present

## 2024-05-23 DIAGNOSIS — F4312 Post-traumatic stress disorder, chronic: Secondary | ICD-10-CM | POA: Diagnosis not present

## 2024-05-31 DIAGNOSIS — F323 Major depressive disorder, single episode, severe with psychotic features: Secondary | ICD-10-CM | POA: Diagnosis not present

## 2024-05-31 DIAGNOSIS — F411 Generalized anxiety disorder: Secondary | ICD-10-CM | POA: Diagnosis not present

## 2024-07-27 ENCOUNTER — Other Ambulatory Visit: Payer: Self-pay

## 2024-08-31 ENCOUNTER — Ambulatory Visit: Admitting: Podiatry

## 2024-08-31 ENCOUNTER — Encounter: Payer: Self-pay | Admitting: Podiatry

## 2024-08-31 VITALS — Ht 61.0 in | Wt 120.0 lb

## 2024-08-31 DIAGNOSIS — B351 Tinea unguium: Secondary | ICD-10-CM

## 2024-08-31 MED ORDER — TERBINAFINE HCL 250 MG PO TABS: 250.0000 mg | ORAL_TABLET | Freq: Every day | ORAL | 0 refills | Status: AC

## 2024-08-31 NOTE — Progress Notes (Signed)
" ° °  Chief Complaint  Patient presents with   Nail Problem    Pt is here due to left great toenail, nail is discolored, states it has been like this for years.    Subjective: 23 y.o. female presenting today as a new patient with her mother for evaluation of discoloration with thickening to the right hallux nail plate.  Onset around 2021.  There is a history of injury to the nail plate as well.  Past Medical History:  Diagnosis Date   Acne    Accutane  therapy. iPledge: [id number]   Anxiety    Asthma    Depression     No past surgical history on file.  Allergies[1]   RT hallux nail plate 87/80/7974  Objective: Physical Exam General: The patient is alert and oriented x3 in no acute distress.  Dermatology: Hyperkeratotic, discolored, thickened, onychodystrophy noted to the right hallux nail plate.   Vascular: Palpable pedal pulses bilaterally. No edema or erythema noted. Capillary refill within normal limits.  Neurological: Grossly intact via light touch  Musculoskeletal Exam: No pedal deformity noted  Assessment: #1 Onychomycosis of toenail right hallux nail plate complicated by h/o trauma  Plan of Care:  -Patient was evaluated. -Today we discussed different treatment options including oral, topical, and laser antifungal treatment modalities.  We discussed their efficacies and side effects.  Patient opts for oral antifungal treatment modality -Prescription for Lamisil  250 mg #90 daily. Pt denies a history of liver pathology or symptoms.  CMP 03/01/2024 hepatic function WNL -Return to clinic 6 months   Thresa EMERSON Sar, DPM [facility]  Dr. Thresa EMERSON Sar, DPM    [address] [place], [place] [address]                Office [phone]  Fax [phone]        [1]  Allergies Allergen Reactions   Omnicef [Cefdinir] Rash   "

## 2024-09-12 ENCOUNTER — Ambulatory Visit: Admitting: Licensed Practical Nurse

## 2024-09-12 ENCOUNTER — Other Ambulatory Visit (HOSPITAL_COMMUNITY)
Admission: RE | Admit: 2024-09-12 | Discharge: 2024-09-12 | Disposition: A | Discharge status: Home / Self Care | Source: Ambulatory Visit | Attending: Licensed Practical Nurse | Admitting: Licensed Practical Nurse

## 2024-09-12 ENCOUNTER — Encounter: Payer: Self-pay | Admitting: Licensed Practical Nurse

## 2024-09-12 VITALS — BP 99/67 | HR 66 | Resp 16 | Ht 61.0 in | Wt 128.8 lb

## 2024-09-12 DIAGNOSIS — Z124 Encounter for screening for malignant neoplasm of cervix: Secondary | ICD-10-CM | POA: Insufficient documentation

## 2024-09-12 DIAGNOSIS — N946 Dysmenorrhea, unspecified: Secondary | ICD-10-CM

## 2024-09-12 DIAGNOSIS — N926 Irregular menstruation, unspecified: Secondary | ICD-10-CM | POA: Diagnosis not present

## 2024-09-12 DIAGNOSIS — A6004 Herpesviral vulvovaginitis: Secondary | ICD-10-CM

## 2024-09-12 DIAGNOSIS — Z113 Encounter for screening for infections with a predominantly sexual mode of transmission: Secondary | ICD-10-CM | POA: Insufficient documentation

## 2024-09-12 DIAGNOSIS — B009 Herpesviral infection, unspecified: Secondary | ICD-10-CM

## 2024-09-12 MED ORDER — VALACYCLOVIR HCL 1 G PO TABS: 1000.0000 mg | ORAL_TABLET | Freq: Two times a day (BID) | ORAL | 3 refills | Status: AC

## 2024-09-12 MED ORDER — VALACYCLOVIR HCL 500 MG PO TABS: 500.0000 mg | ORAL_TABLET | Freq: Every day | ORAL | 12 refills | Status: DC

## 2024-09-12 NOTE — Progress Notes (Unsigned)
 "   [name], MD   Chief Complaint  Patient presents with   Abdominal Pain   Exposure to STD    HPI:      Shelly Knight is a 23 y.o. G1P0 whose LMP was No LMP recorded. (Menstrual status: Oral contraceptives)., presents today for HSV outbreak and lower abdominal pain.   Here with her mother -Desires STD screening , sexually active with female partners has had > 3 in her lifetime has not had previous screening.   -Wonders if she has PCOS, has unwanted hair around aeorola   -Pain: lower abdomen-both sides but more on the left sharp like being stabbed, occurs at the start of cycle also when she has an outbreak she has pain in her groin, lymph nodes swell -takes Naproxen , uses heat which helps Cycles are monthly unsure of length are unpredictable but occur monthly, LMP last week When not on hormones, is not sure what her cycle is like.  SABx 2, 2018 2024   Taking OCP  Does have pain with IC, all of time, started last year, occurs when her partner enters and during penetration, is able to wear a tampon but can be uncomfortable   Has herpes outbreak, had and outbreak over a week ago, went to [facility] and was given medication, it resolved then another outbreak started. The current outbreak started a few days ago, she ran out of medication, has been taking medication only with outbreaks, tends to get an outbreak once a month around the the time of her cycle or under stress.  Gets outbreaks almost monthly   Mother had hystrercetmy at age 72 d/t endometriosis and cancer   Maezie has never had a pap smear      Patient Active Problem List   Diagnosis Date Noted   Cellulitis 05/30/2023   Acne vulgaris 11/23/2019   Generalized anxiety disorder 04/30/2019   Major depressive disorder, single episode, moderate (HCC) 04/30/2019   Insomnia due to mental condition 04/30/2019   Noncompliance with medication regimen 04/30/2019    History reviewed. No pertinent surgical  history.  Family History  Problem Relation Age of Onset   Kidney cancer Maternal Uncle    Bipolar disorder Maternal Uncle    Alcohol abuse Maternal Uncle    Breast cancer Other    Bipolar disorder Mother    Anxiety disorder Father    Bipolar disorder Sister    Bipolar disorder Maternal Grandfather     Social History   Socioeconomic History   Marital status: Single    Spouse name: Not on file   Number of children: Not on file   Years of education: Not on file   Highest education level: High school graduate  Occupational History   Not on file  Tobacco Use   Smoking status: Never   Smokeless tobacco: Never  Vaping Use   Vaping status: Never Used  Substance and Sexual Activity   Alcohol use: No   Drug use: No   Sexual activity: Yes    Birth control/protection: Pill  Other Topics Concern   Not on file  Social History Narrative   Not on file   Social Drivers of Health   Tobacco Use: Low Risk (09/12/2024)   Patient History    Smoking Tobacco Use: Never    Smokeless Tobacco Use: Never    Passive Exposure: Not on file  Financial Resource Strain: Low Risk  (04/20/2024)   Received from [facility]   Overall Financial Resource Strain (CARDIA)  Difficulty of Paying Living Expenses: Not hard at all  Food Insecurity: No Food Insecurity (04/20/2024)   [facility] from [facility]   Epic    Within the past 12 months, you worried that your food would run out before you got the money to buy more.: Never true    Within the past 12 months, the food you bought just didn't last and you didn't have money to get more.: Never true  Transportation Needs: No Transportation Needs (04/20/2024)   Received from [facility] - Transportation    In the past 12 months, has lack of transportation kept you from medical appointments or from getting medications?: No    Lack of Transportation (Non-Medical): No  Physical Activity: Not on file   Stress: Not on file  Social Connections: Not on file  Intimate Partner Violence: Not on file  Depression (EYV7-0): Not on file  Alcohol Screen: Not on file  Housing: Low Risk  (04/20/2024)   [facility] from [facility]   Epic    In the last 12 months, was there a time when you were not able to pay the mortgage or rent on time?: No    In the past 12 months, how many times have you moved where you were living?: 0    At any time in the past 12 months, were you homeless or living in a shelter (including now)?: No  Utilities: Not At Risk (04/20/2024)   Received from [facility]   Epic    In the past 12 months has the electric, gas, oil, or water company threatened to shut off services in your home?: No  Health Literacy: Not on file    Outpatient Medications Prior to Visit  Medication Sig Dispense Refill   Adapalene  (DIFFERIN ) 0.3 % gel Apply pea sized amount to face, back, chest qhs as tolerated 45 g 6   albuterol  (VENTOLIN  HFA) 108 (90 Base) MCG/ACT inhaler Inhale 2 puffs into the lungs every 4 (four) hours as needed for wheezing or shortness of breath. 8 g 0   clindamycin  (CLEOCIN  T) 1 % external solution Apply topically qd/bid to face, chest, back for acne 60 mL 6   clindamycin  (CLEOCIN -T) 1 % lotion Apply topically daily. qd to face, back and chest for acne 60 mL 3   doxycycline  (PERIOSTAT ) 20 MG tablet Take 2 tablets (40 mg total) by mouth daily. With food and drink for acne 60 tablet 6   lamoTRIgine  (LAMICTAL ) 25 MG tablet Take 2 tablets (50 mg total) by mouth every 12 (twelve) hours. 120 tablet 1   norethindrone-ethinyl estradiol (ORTHO-NOVUM 7/7/7, 28,) 0.5/0.75/1-35 MG-MCG tablet Take 1 tablet by mouth daily. 3 Package 3   QUEtiapine  (SEROQUEL ) 50 MG tablet Take 1 tablet (50 mg total) by mouth at bedtime. 30 tablet 1   spironolactone  (ALDACTONE ) 100 MG tablet Take 1 tablet (100 mg total) by mouth daily. 30 tablet 6   terbinafine  (LAMISIL ) 250 MG tablet  Take 1 tablet (250 mg total) by mouth daily. 90 tablet 0   metroNIDAZOLE  (FLAGYL ) 500 MG tablet Take 1 tablet (500 mg total) by mouth 2 (two) times daily. 14 tablet 0   No facility-administered medications prior to visit.      ROS:  Review of Systems see HPI    OBJECTIVE:   Vitals:  BP 99/67   Pulse 66   Resp 16   Ht 5' 1 (1.549 m)   Wt 128  lb 12.8 oz (58.4 kg)   BMI 24.34 kg/m   Physical Exam Constitutional:      Appearance: She is well-developed.  Pulmonary:     Effort: Pulmonary effort is normal.  Abdominal:     General: Abdomen is flat.     Tenderness: There is no abdominal tenderness.  Genitourinary:    General: Normal vulva.     Comments: 3 pinpoint lesion on mons pubis  SSE: cervix pink, no lesions, small amount of physiologic discharge   Bimanual exam: uterus slightly larger than gravid, non tender no masses, adnexa non tender no masses not enlarged, no CMT  Musculoskeletal:     Cervical back: Normal range of motion.  Skin:    General: Skin is warm.  Neurological:     General: No focal deficit present.     Mental Status: She is alert.  Psychiatric:        Mood and Affect: Mood normal.        Thought Content: Thought content normal.     Results: No results found for this or any previous visit (from the past 24 hours).   Assessment/Plan: 1. Screen for STD (sexually transmitted disease) - Cervicovaginal ancillary only - HEP, RPR, HIV Panel - Hepatitis C Antibody  2. 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 When you have an outbreak Take for ten days.  Dispense: 20 tablet; Refill: 3  3. Cervical cancer screening - Cytology - PAP  4. Irregular periods/menstrual cycles - Beta HCG, Quant - TSH+Prl+FSH+TestT+LH+DHEA S...  5. Herpes  6. Painful menstrual periods (Primary) - US  PELVIS (TRANSABDOMINAL ONLY); Future     Meds ordered  this encounter  Medications   DISCONTD: valACYclovir  (VALTREX ) 500 MG tablet    Sig: Take 1 tablet (500 mg total) by mouth daily. Can increase to twice a day for 5 days in the event of a recurrence    Dispense:  30 tablet    Refill:  12   valACYclovir  (VALTREX ) 1000 MG tablet    Sig: Take 1 tablet (1,000 mg total) by mouth 2 (two) times daily. When you have an outbreak Take for ten days.    Dispense:  20 tablet    Refill:  3   -Will start Valtrex  1,000mg  BID x 10 days followed by Valtrex  500mg  daily  -Discussed her symptoms could be related to endometriosis, diagnosis is done with a surgical procedure, we start by treating symptoms-consider continuous use OCP or IUD.  -PCOS labs ordered -Pelvic US  ordered -RTC to discuss labs and US  and treatment options.  -due for annual exam   JINNIE HERO [facility], CNM 09/14/2024 7:49 AM      "

## 2024-09-17 LAB — CERVICOVAGINAL ANCILLARY ONLY
Bacterial Vaginitis (gardnerella): NEGATIVE
Candida Glabrata: NEGATIVE
Candida Vaginitis: POSITIVE — AB
Chlamydia: NEGATIVE
Comment: NEGATIVE
Comment: NEGATIVE
Comment: NEGATIVE
Comment: NEGATIVE
Comment: NEGATIVE
Comment: NORMAL
Neisseria Gonorrhea: NEGATIVE
Trichomonas: NEGATIVE

## 2024-09-17 LAB — HEPATITIS C ANTIBODY: Hep C Virus Ab: NONREACTIVE

## 2024-09-17 LAB — HEP, RPR, HIV PANEL
HIV Screen 4th Generation wRfx: NONREACTIVE
Hepatitis B Surface Ag: NEGATIVE
RPR Ser Ql: NONREACTIVE

## 2024-09-17 LAB — TSH+PRL+FSH+TESTT+LH+DHEA S...
17-Hydroxyprogesterone: 27 ng/dL
Androstenedione: 82 ng/dL (ref 41–262)
DHEA-SO4: 127 ug/dL (ref 110.0–431.7)
FSH: 7.3 m[IU]/mL
LH: 9.5 m[IU]/mL
Prolactin: 14 ng/mL (ref 4.8–33.4)
TSH: 1.96 u[IU]/mL (ref 0.450–4.500)
Testosterone, Free: 1.1 pg/mL (ref 0.0–4.2)
Testosterone: 9 ng/dL — ABNORMAL LOW (ref 13–71)

## 2024-09-17 LAB — BETA HCG QUANT (REF LAB): hCG Quant: <1 m[IU]/mL

## 2024-09-18 ENCOUNTER — Ambulatory Visit: Payer: Self-pay | Admitting: Licensed Practical Nurse

## 2024-09-21 LAB — CYTOLOGY - PAP
Comment: NEGATIVE
Comment: NEGATIVE
Comment: NEGATIVE
Diagnosis: UNDETERMINED — AB
HPV 16: NEGATIVE
HPV 18 / 45: NEGATIVE
High risk HPV: POSITIVE — AB

## 2024-09-27 ENCOUNTER — Other Ambulatory Visit: Payer: Self-pay | Admitting: Licensed Practical Nurse

## 2024-09-27 ENCOUNTER — Ambulatory Visit: Admitting: Licensed Practical Nurse

## 2024-09-27 ENCOUNTER — Ambulatory Visit

## 2024-09-27 DIAGNOSIS — B009 Herpesviral infection, unspecified: Secondary | ICD-10-CM

## 2024-09-27 DIAGNOSIS — N946 Dysmenorrhea, unspecified: Secondary | ICD-10-CM | POA: Diagnosis not present

## 2024-09-27 DIAGNOSIS — Z113 Encounter for screening for infections with a predominantly sexual mode of transmission: Secondary | ICD-10-CM

## 2024-09-27 DIAGNOSIS — Z124 Encounter for screening for malignant neoplasm of cervix: Secondary | ICD-10-CM

## 2024-09-27 DIAGNOSIS — N926 Irregular menstruation, unspecified: Secondary | ICD-10-CM

## 2024-09-27 DIAGNOSIS — A6004 Herpesviral vulvovaginitis: Secondary | ICD-10-CM

## 2024-10-07 NOTE — Progress Notes (Signed)
 Chief Complaint:   Chief Complaint  Patient presents with   Follow-up    Subjective:   Shelly Knight is a 24 y.o. female in today for: Follow-up today  History of Present Illness Shelly Knight is a 24 year old female who presents for medication management of mood instability and obsessive-compulsive symptoms. She is accompanied by her mother, Marval.  She has taken Seroquel  a few months ago when it was presrcibed, which has significantly improved her sleep without any side effects. She has not used Lamictal  recently but recalls it was effective for mood stabilization, particularly in reducing irritability and emotional outbursts.    She complains of obsessive-compulsive symptoms, describing a need to ensure things are in the 'right spot' and feeling compelled to clean thoroughly. These thoughts are mentally exhausting and difficult to control. She has not been on medication specifically for these symptoms before.  She is currently taking spironolactone  for acne and doxycycline  20 mg once daily, with no significant side effects reported from these medications.  There is a family history of prolonged QT syndrome. She has not had any recent EKGs.  She has been prescribed terbinafine  for a toenail condition but has not yet started the medication.   Current Outpatient Medications  Medication Sig Dispense Refill   albuterol  MDI, PROVENTIL , VENTOLIN , PROAIR , HFA 90 mcg/actuation inhaler Inhale 2 inhalations into the lungs every 4 (four) hours as needed for Wheezing or Shortness of Breath     clindamycin  (CLEOCIN  T) 1 % topical solution Apply topically to aa of face for acne qd/bid     doxycycline  (PERIOSTAT ) 20 MG tablet Take 20 mg by mouth once daily     mometasone (ELOCON) 0.1 % ointment May apply to affected area once daily as needed for 1 to 2 weeks. 45 g 0   naproxen  (NAPROSYN ) 500 MG tablet TAKE 1 TABLET(500 MG) BY MOUTH TWICE DAILY FOR UP TO 10 DAYS AS NEEDED 20 tablet  0   norethindrone-ethinyl estradiol triphasic (NORTREL 7/7/7) 0.5/0.75/1 mg- 35 mcg tablet Take 1 tablet by mouth once daily 30 tablet 3   QUEtiapine  (SEROQUEL ) 50 MG tablet Take 1 tablet (50 mg total) by mouth at bedtime for 60 days 30 tablet 1   spironolactone  (ALDACTONE ) 100 MG tablet Take 100 mg by mouth once daily     tretinoin  (RETIN-A ) 0.05 % cream      FLUoxetine (PROZAC) 20 MG capsule Take 1 capsule (20 mg total) by mouth once daily 30 capsule 2   lamoTRIgine  (LAMICTAL  XR) 50 mg XR tablet Take 1 tablet (50 mg total) by mouth once daily 30 tablet 2   terBINafine  HCL (LAMISIL ) 250 mg tablet Take 1 tablet (250 mg total) by mouth once daily 30 tablet 1   No current facility-administered medications for this visit.    Allergies as of 10/05/2024 - Reviewed 10/05/2024  Allergen Reaction Noted   Cefdinir Rash 01/25/2018    No past medical history on file.  History reviewed. No pertinent surgical history.   No family history on file.  Social History:  reports that she has never smoked. She has never been exposed to tobacco smoke. She has never used smokeless tobacco. She reports that she does not currently use alcohol. She reports that she does not use drugs.  Results for orders placed or performed in visit on 10/05/24  CBC w/auto Differential (5 Part)  Result Value Ref Range   WBC (White Blood Cell Count) 8.1 4.1 - 10.2 103/uL   RBC (Red Blood  Cell Count) 4.07 4.04 - 5.48 106/uL   Hemoglobin 13.0 12.0 - 15.0 gm/dL   Hematocrit 61.5 64.9 - 47.0 %   MCV (Mean Corpuscular Volume) 94.3 80.0 - 100.0 fl   MCH (Mean Corpuscular Hemoglobin) 31.9 (H) 27.0 - 31.2 pg   MCHC (Mean Corpuscular Hemoglobin Concentration) 33.9 32.0 - 36.0 gm/dL   Platelet Count 742 849 - 450 103/uL   RDW-CV (Red Cell Distribution Width) 11.9 11.6 - 14.8 %   MPV (Mean Platelet Volume) 9.8 9.4 - 12.4 fl   Neutrophils 4.42 1.50 - 7.80 103/uL   Lymphocytes 2.79 1.00 - 3.60 103/uL   Monocytes 0.72  0.00 - 1.50 103/uL   Eosinophils 0.14 0.00 - 0.55 103/uL   Basophils 0.04 0.00 - 0.09 103/uL   Neutrophil % 54.4 32.0 - 70.0 %   Lymphocyte % 34.3 10.0 - 50.0 %   Monocyte % 8.9 4.0 - 13.0 %   Eosinophil % 1.7 1.0 - 5.0 %   Basophil% 0.5 0.0 - 2.0 %   Immature Granulocyte % 0.2 <=0.7 %   Immature Granulocyte Count 0.02 <=0.06 10^3/L  Comprehensive Metabolic Panel (CMP)  Result Value Ref Range   Glucose 72 70 - 110 mg/dL   Sodium 860 863 - 854 mmol/L   Potassium 4.3 3.6 - 5.1 mmol/L   Chloride 103 97 - 109 mmol/L   Carbon Dioxide (CO2) 32.8 (H) 22.0 - 32.0 mmol/L   Urea  Nitrogen (BUN) 14 7 - 25 mg/dL   Creatinine 0.6 0.6 - 1.1 mg/dL   Glomerular Filtration Rate (eGFR) 128 >60 mL/min/1.73sq m   Calcium 9.5 8.7 - 10.3 mg/dL   AST  18 8 - 39 U/L   ALT  12 5 - 38 U/L   Alk Phos (alkaline Phosphatase) 51 34 - 104 U/L   Albumin 4.8 3.5 - 4.8 g/dL   Bilirubin, Total 0.5 0.3 - 1.2 mg/dL   Protein, Total 7.1 6.1 - 7.9 g/dL   A/G Ratio 2.1 1.0 - 5.0 gm/dL  Vitamin A87  Result Value Ref Range   Vitamin B12 331 >300 pg/mL   Narrative   <200 pg/mL:    Low, consistent with Vitamin B12 Deficiency 200-300 pg/mL: Borderline, possible Vitamin B12 Deficiency >300 pg/mL:    Normal.  Vitamin B12 Deficiency is unlikely   Vitamin D, 25-Hydroxy - [facility]  Result Value Ref Range   Vitamin D, 25-Hydroxy - [facility] 36.3 30.0 - 100.0 ng/mL   Narrative   Performed at:  [address] [place], [place]  [id number] Lab Director: Frankey Sas MD, Phone:  [phone]      ROS:  General: No fever, chills or recent illness. No change in weight  HEENT: No change in vision or hearing. No pain or difficulty with swallowing Respiratory: No cough or shortness of breath CV:  No chest pain or palpitations GI:  No pain, dyspepsia or change in bowel habits GU:  No dysuria, frequency, or hesitancy MSK:  No joint pain or injury Neurological: No headaches, changes in mental status,  loss of sensation or strength  Psychological:  has insomnia, anxiety, depression and OCD symptoms  Objective:   Body mass index is 24.71 kg/m.  BP 102/66   Pulse 76   Ht 154.9 cm (5' 1)   Wt 59.3 kg (130 lb 12.8 oz)   LMP 10/04/2024 (Exact Date)   SpO2 98%   BMI 24.71 kg/m   General: WD/WN female, in no acute distress HEENT: Pupils equal and round,  EOMI. oral mucosa moist.  Oropharynx clear. Neck: supple, trachea midline; no thyromegaly Respiratory:clear to auscultation.  No dullness to percussion.  No use of accessory muscles. Cardiac:  Regular rate and rhythm without murmur, gallops, or rubs  Abdominal:soft, nontender, positive bowel sounds.  No organomegaly. Musculoskeletal:  No clubbing, cyanosis or edema.  Full range of motion in upper and lower extremities bilaterally. Neuro: CN grossly intact.  No acute decrease in sensation in the upper and lower extremities bilaterally.  Lymph: no cervical or supraclavicular lymphadenopathy   Assessment/Plan:   Anxiety, generalized  (primary encounter diagnosis) PTSD (post-traumatic stress disorder) Moderate episode of recurrent major depressive disorder (CMS-HCC) Onychomycosis At risk for prolonged QT interval syndrome  Assessment & Plan   Obsessive-compulsive disorder - Experiences obsessive thoughts and behaviors. - never been on any meds for this - Prescribed Zoloft  (sertraline ) for OCD symptoms. - Follow up in two months to assess response.  2. Generalized anxiety disorder and mood disorder- - psych referral was sent several times, but patient did not follow up - restarted Lamictal . - Prescribed Zoloft  (sertraline ) for anxiety and OCD symptoms.  3. Chronic insomnia Seroquel  effective for insomnia without side effects. - restarted Seroquel  for insomnia management.  4. Premenstrual dysphoric disorder - Managed with birth control and spironolactone .    5. Onychomycosis - Podiatrist prescribed terbinafine . Liver  function tests required before initiation. - Ordered liver function tests. - Prescribed terbinafine .  6. Family h/o prolonged QT syndrome - Family history of prolonged QT interval necessitates EKG monitoring. -Patient has had prior EKGs which did not show any prolonged QT. - Family requested cardiology monitoring.  Discussed discussed EKG today which they deferred - Cardiology referral sent    Follow-up with me in 2 months for follow-up of OCD, generalized anxiety and mood disorder      Goals      Follow my doctor's care plan        LAVENIA BEAVER, MD  Portions of this note were created using dictation software and may contain typographical errors.

## 2024-10-10 ENCOUNTER — Ambulatory Visit: Payer: Self-pay | Admitting: Licensed Practical Nurse

## 2024-10-12 ENCOUNTER — Ambulatory Visit: Admitting: Licensed Practical Nurse

## 2024-10-12 ENCOUNTER — Encounter: Payer: Self-pay | Admitting: Licensed Practical Nurse

## 2024-10-12 VITALS — BP 104/75 | HR 58 | Ht 61.0 in | Wt 129.8 lb

## 2024-10-12 DIAGNOSIS — N83202 Unspecified ovarian cyst, left side: Secondary | ICD-10-CM

## 2024-10-12 DIAGNOSIS — R87618 Other abnormal cytological findings on specimens from cervix uteri: Secondary | ICD-10-CM | POA: Insufficient documentation

## 2024-10-12 DIAGNOSIS — D259 Leiomyoma of uterus, unspecified: Secondary | ICD-10-CM | POA: Insufficient documentation

## 2024-10-12 DIAGNOSIS — N83209 Unspecified ovarian cyst, unspecified side: Secondary | ICD-10-CM | POA: Insufficient documentation

## 2024-10-12 NOTE — Progress Notes (Signed)
 "   [name], MD   No chief complaint on file.   HPI:      Shelly Knight is a 24 y.o. G1P0 whose LMP was No LMP recorded. (Menstrual status: Oral contraceptives)., presents today for follow up on pelvic US  and labs, Here with her mother. She did not read my most recent mychart message.   Reports having Left sided pain, it feels like pinching, when she stretches it will hurt. Uses heating pad, which helps, has not tried Naproxen , tries to avoid medication when she can.   Cycles: monthly  heavy bleed day 1 -2 change pad every few hours,then light bleeding  even while on OCP     Patient Active Problem List   Diagnosis Date Noted   Uterine fibroid 10/12/2024   Ovarian cyst 10/12/2024   Abnormal Papanicolaou smear of cervix with positive human papilloma virus (HPV) test 10/12/2024   Cellulitis 05/30/2023   Acne vulgaris 11/23/2019   Generalized anxiety disorder 04/30/2019   Major depressive disorder, single episode, moderate (HCC) 04/30/2019   Insomnia due to mental condition 04/30/2019   Noncompliance with medication regimen 04/30/2019    No past surgical history on file.  Family History  Problem Relation Age of Onset   Kidney cancer Maternal Uncle    Bipolar disorder Maternal Uncle    Alcohol abuse Maternal Uncle    Breast cancer Other    Bipolar disorder Mother    Anxiety disorder Father    Bipolar disorder Sister    Bipolar disorder Maternal Grandfather     Social History   Socioeconomic History   Marital status: Single    Spouse name: Not on file   Number of children: Not on file   Years of education: Not on file   Highest education level: High school graduate  Occupational History   Not on file  Tobacco Use   Smoking status: Never   Smokeless tobacco: Never  Vaping Use   Vaping status: Never Used  Substance and Sexual Activity   Alcohol use: No   Drug use: No   Sexual activity: Yes    Birth control/protection: Pill  Other Topics Concern    Not on file  Social History Narrative   Not on file   Social Drivers of Health   Tobacco Use: Low Risk  (10/05/2024)   Received from [facility]   Patient History    Smoking Tobacco Use: Never    Smokeless Tobacco Use: Never    Passive Exposure: Never  Financial Resource Strain: Low Risk  (10/05/2024)   Received from [facility]   Overall Financial Resource Strain (CARDIA)    Difficulty of Paying Living Expenses: Not very hard  Food Insecurity: No Food Insecurity (10/05/2024)   Received from [facility]   Epic    Within the past 12 months, you worried that your food would run out before you got the money to buy more.: Never true    Within the past 12 months, the food you bought just didn't last and you didn't have money to get more.: Never true  Transportation Needs: No Transportation Needs (10/05/2024)   Received from [facility] - Transportation    In the past 12 months, has lack of transportation kept you from medical appointments or from getting medications?: No    Lack of Transportation (Non-Medical): No  Physical Activity: Not on file  Stress: Not on file  Social Connections: Not on  file  Intimate Partner Violence: Not on file  Depression (EYV7-0): Not on file  Alcohol Screen: Not on file  Housing: Low Risk  (10/05/2024)   Received from [facility]   Epic    In the last 12 months, was there a time when you were not able to pay the mortgage or rent on time?: No    In the past 12 months, how many times have you moved where you were living?: 0    At any time in the past 12 months, were you homeless or living in a shelter (including now)?: No  Utilities: Not At Risk (10/05/2024)   Received from [facility]   Epic    In the past 12 months has the electric, gas, oil, or water company threatened to shut off services in your home?: No  Health Literacy: Not on file     Outpatient Medications Prior to Visit  Medication Sig Dispense Refill   Adapalene  (DIFFERIN ) 0.3 % gel Apply pea sized amount to face, back, chest qhs as tolerated 45 g 6   albuterol  (VENTOLIN  HFA) 108 (90 Base) MCG/ACT inhaler Inhale 2 puffs into the lungs every 4 (four) hours as needed for wheezing or shortness of breath. 8 g 0   clindamycin  (CLEOCIN  T) 1 % external solution Apply topically qd/bid to face, chest, back for acne 60 mL 6   clindamycin  (CLEOCIN -T) 1 % lotion Apply topically daily. qd to face, back and chest for acne 60 mL 3   doxycycline  (PERIOSTAT ) 20 MG tablet Take 2 tablets (40 mg total) by mouth daily. With food and drink for acne 60 tablet 6   lamoTRIgine  (LAMICTAL ) 25 MG tablet Take 2 tablets (50 mg total) by mouth every 12 (twelve) hours. 120 tablet 1   norethindrone-ethinyl estradiol (ORTHO-NOVUM 7/7/7, 28,) 0.5/0.75/1-35 MG-MCG tablet Take 1 tablet by mouth daily. 3 Package 3   QUEtiapine  (SEROQUEL ) 50 MG tablet Take 1 tablet (50 mg total) by mouth at bedtime. 30 tablet 1   spironolactone  (ALDACTONE ) 100 MG tablet Take 1 tablet (100 mg total) by mouth daily. 30 tablet 6   terbinafine  (LAMISIL ) 250 MG tablet Take 1 tablet (250 mg total) by mouth daily. 90 tablet 0   valACYclovir  (VALTREX ) 1000 MG tablet Take 1 tablet (1,000 mg total) by mouth 2 (two) times daily. When you have an outbreak Take for ten days. 20 tablet 3   No facility-administered medications prior to visit.      ROS:  Review of Systems see HPI    OBJECTIVE:   Vitals:  BP 104/75   Pulse (!) 58   Ht 5' 1 (1.549 m)   Wt 129 lb 12.8 oz (58.9 kg)   BMI 24.53 kg/m   Physical Exam Constitutional:      Appearance: Normal appearance.  Pulmonary:     Effort: Pulmonary effort is normal.  Neurological:     Mental Status: She is alert.  Psychiatric:        Mood and Affect: Mood normal.        Thought Content: Thought content normal.        Judgment: Judgment normal.     Results: No  results found for this or any previous visit (from the past 24 hours).   Assessment/Plan: 1. Uterine leiomyoma, unspecified location (Primary) - Ambulatory referral to Obstetrics / Gynecology  2. Cyst of left ovary - Ambulatory referral to Obstetrics / Gynecology  3. Abnormal Papanicolaou smear of cervix with positive  human papilloma virus (HPV) test   Fibroids: discussed this could be the cause of her sxs of pain with sex, may or may not contributed to her miscarriages. At your last visit your uterus did feel a little bigger than expected, this could be because of the fibroids. Treatment options 1) watchful waiting 2) hormones to control heavy bleeding which you are already on 3) GnRH agonists-this is not a long term solution, it can give you sxs of menopause, it is often used prior to surgery 4) Surgery-I am not sure how this is done, could be through the vagina or laparoscopically, surgery recommendations are based on the size of the fibroid and symptoms. 5) Uterine artery embolization-this can preserve the uterus if you desire a future pregnancy. I am not sure the best option is for  you. A referral has been placed to see a careers adviser.   Ovarian cyst: These often go away on their own, this cyst is 5cm, at this size we worry about torsion. Surgical removal may be an option this is often done based on the size and the pt's symptoms. You can discuss this with the surgeon. You may use Naprexon for the pain. Please go to the ED if you have severe pain that causes you to vomit, wakes you up at night or makes you not be able to get out of a particular position.    Abnormal Pap: ASC-US - abnormal cells were found but probably not cancerous, HPV positive-this can clear on its own. At this time all  you need to do is repeat the pap in 1 year. The pt did not have the HPV vaccine   PCOS: your labs were done while on hormones, it is difficult to tell if you have PCOS.    No orders of the defined types were  placed in this encounter.    JINNIE HERO [facility], CNM 10/12/2024 5:29 PM      "

## 2024-10-12 NOTE — Assessment & Plan Note (Signed)
 Repeat pap in 1 year.

## 2024-11-12 ENCOUNTER — Ambulatory Visit: Admitting: Dermatology

## 2024-12-12 ENCOUNTER — Encounter: Payer: Self-pay | Admitting: Obstetrics and Gynecology

## 2025-03-08 ENCOUNTER — Ambulatory Visit: Admitting: Podiatry
